# Patient Record
Sex: Male | Born: 1990 | Hispanic: Yes | Marital: Single | Smoking: Former smoker
Health system: Southern US, Community
[De-identification: ages and names within clinical notes are randomized; demographics above are authoritative.]

---
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
## 2018-10-03 ENCOUNTER — Encounter: Payer: Self-pay | Admitting: Emergency Medicine

## 2018-10-03 ENCOUNTER — Emergency Department
Admission: EM | Admit: 2018-10-03 | Discharge: 2018-10-03 | Disposition: A | Discharge status: Home / Self Care | Payer: Self-pay | Attending: Emergency Medicine | Admitting: Emergency Medicine

## 2018-10-03 DIAGNOSIS — L980 Pyogenic granuloma: Secondary | ICD-10-CM | POA: Insufficient documentation

## 2018-10-03 DIAGNOSIS — L03317 Cellulitis of buttock: Secondary | ICD-10-CM | POA: Insufficient documentation

## 2018-10-03 MED ORDER — SULFAMETHOXAZOLE-TRIMETHOPRIM 800-160 MG PO TABS
1.0000 | ORAL_TABLET | Freq: Once | ORAL | Status: AC
  Administered 2018-10-03: 1 via ORAL
  Filled 2018-10-03: qty 1

## 2018-10-03 MED ORDER — TRAMADOL HCL 50 MG PO TABS: 50.0000 mg | ORAL_TABLET | Freq: Four times a day (QID) | ORAL | 0 refills | Status: DC | PRN

## 2018-10-03 MED ORDER — HYDROCODONE-ACETAMINOPHEN 5-325 MG PO TABS
1.0000 | ORAL_TABLET | Freq: Once | ORAL | Status: AC
  Administered 2018-10-03: 1 via ORAL
  Filled 2018-10-03: qty 1

## 2018-10-03 MED ORDER — SULFAMETHOXAZOLE-TRIMETHOPRIM 800-160 MG PO TABS: 1.0000 | ORAL_TABLET | Freq: Two times a day (BID) | ORAL | 0 refills | Status: DC

## 2018-10-03 NOTE — ED Provider Notes (Signed)
[facility] Emergency Department Provider Note  ____________________________________________  Time seen: Approximately 9:52 PM  I have reviewed the triage vital signs and the nursing notes.   HISTORY  Chief Complaint Abscess    HPI Gregory Sanford is a 28 y.o. male who presents the emergency department complaining of 2 different skin lesions.  Patient has noted lesions to the right inguinal region that have been there for months.  Patient reports that he has 3 well-circumscribed protruding lesions.  Areas are tender to palpation.  No surrounding erythema or edema.  These are nontender at rest.  Patient's main concern is a erythematous, painful lesion to the buttocks.  Patient reports that it is between the 2 buttocks is, worse on the right than left.  Patient denies any drainage from the area.  No fevers or chills.  No abdominal pain.  No other complaints at this time.         History reviewed. No pertinent past medical history.  There are no active problems to display for this patient.   History reviewed. No pertinent surgical history.  Prior to Admission medications   Medication Sig Start Date End Date Taking? Authorizing Provider  sulfamethoxazole-trimethoprim (BACTRIM DS) 800-160 MG tablet Take 1 tablet by mouth 2 (two) times daily. 10/03/18   Cuthriell, Delorise Royals, PA-C  traMADol (ULTRAM) 50 MG tablet Take 1 tablet (50 mg total) by mouth every 6 (six) hours as needed. 10/03/18   Cuthriell, Delorise Royals, PA-C    Allergies Patient has no known allergies.  History reviewed. No pertinent family history.  Social History Social History   Tobacco Use  . Smoking status: Never Smoker  . Smokeless tobacco: Never Used  Substance Use Topics  . Alcohol use: Not on file  . Drug use: Not on file     Review of Systems  Constitutional: No fever/chills Eyes: No visual changes. No discharge ENT: No upper respiratory complaints. Cardiovascular: no chest  pain. Respiratory: no cough. No SOB. Gastrointestinal: No abdominal pain.  No nausea, no vomiting. Musculoskeletal: Negative for musculoskeletal pain. Skin: 2 abnormal skin findings, 1 to the right groin 1 to the right buttocks. Neurological: Negative for headaches, focal weakness or numbness. 10-point ROS otherwise negative.  ____________________________________________   PHYSICAL EXAM:  VITAL SIGNS: ED Triage Vitals [10/03/18 2034]  Enc Vitals Group     BP (!) 147/76     Pulse Rate 100     Resp 18     Temp 98 F (36.7 C)     Temp Source Oral     SpO2 100 %     Weight      Height      Head Circumference      Peak Flow      Pain Score      Pain Loc      Pain Edu?      Excl. in GC?      Constitutional: Alert and oriented. Well appearing and in no acute distress. Eyes: Conjunctivae are normal. PERRL. EOMI. Head: Atraumatic. Neck: No stridor.    Cardiovascular: Normal rate, regular rhythm. Normal S1 and S2.  Good peripheral circulation. Respiratory: Normal respiratory effort without tachypnea or retractions. Lungs CTAB. Good air entry to the bases with no decreased or absent breath sounds. Gastrointestinal: Bowel sounds 4 quadrants. Soft and nontender to palpation. No guarding or rigidity. No palpable masses. No distention. No CVA tenderness. Musculoskeletal: Full range of motion to all extremities. No gross deformities appreciated. Neurologic:  Normal  speech and language. No gross focal neurologic deficits are appreciated.  Skin:  Skin is warm, dry and intact. No rash noted.  Examination of the right groin reveals 3 distinct well-circumscribed lesions consistent with pyogenic granuloma.  No active bleeding.  No surrounding erythema or edema concerning for cellulitis.  Patient has a erythematous, raised lesion to the right medial buttocks.  This is almost in the perineal region.  No perianal or perirectal involvement.  Area is firm to palpation with no fluctuance or  induration.  No drainage from the lesion. Psychiatric: Mood and affect are normal. Speech and behavior are normal. Patient exhibits appropriate insight and judgement.   ____________________________________________   LABS (all labs ordered are listed, but only abnormal results are displayed)  Labs Reviewed - No data to display ____________________________________________  EKG   ____________________________________________  RADIOLOGY   No results found.  ____________________________________________    PROCEDURES  Procedure(s) performed:    Procedures    Medications  sulfamethoxazole-trimethoprim (BACTRIM DS) 800-160 MG per tablet 1 tablet (has no administration in time range)     ____________________________________________   INITIAL IMPRESSION / ASSESSMENT AND PLAN / ED COURSE  Pertinent labs & imaging results that were available during my care of the patient were reviewed by me and considered in my medical decision making (see chart for details).  Review of the [place] CSRS was performed in accordance of the [facility] prior to dispensing any controlled drugs.           Patient's diagnosis is consistent with cellulitis of the right buttock, pyogenic granuloma of the right inguinal fold.  Patient presented to the emergency department with a complaint of 2 different skin lesions.  Asymptomatic lesion in the right groin appears to be the pyogenic granuloma.  Patient does have findings consistent with cellulitis of the right buttock with no appreciable abscess requiring incision and drainage.  Patient will be placed on Bactrim for cellulitis, limited prescription of Ultram for pain relief.  Follow-up with dermatology.. Patient is given ED precautions to return to the ED for any worsening or new symptoms.     ____________________________________________  FINAL CLINICAL IMPRESSION(S) / ED DIAGNOSES  Final diagnoses:  Cellulitis of right buttock  Pyogenic granuloma       NEW MEDICATIONS STARTED DURING THIS VISIT:  ED Discharge Orders         Ordered    sulfamethoxazole-trimethoprim (BACTRIM DS) 800-160 MG tablet  2 times daily     10/03/18 2157    traMADol (ULTRAM) 50 MG tablet  Every 6 hours PRN     10/03/18 2157              This chart was dictated using voice recognition software/Dragon. Despite best efforts to proofread, errors can occur which can change the meaning. Any change was purely unintentional.    Racheal PatchesCuthriell, Jonathan D, PA-C 10/03/18 2158    Phineas SemenGoodman, Graydon, MD 10/03/18 2228

## 2018-10-03 NOTE — ED Triage Notes (Signed)
Pt reports raised reddened area to the right inner groin as well as under the left buttocks. Pt reports area to buttocks x1 day and the one on the right groin x3 months. Pt denies drainage, denies fever.

## 2019-03-12 ENCOUNTER — Emergency Department
Admission: EM | Admit: 2019-03-12 | Discharge: 2019-03-13 | Disposition: A | Discharge status: Home / Self Care | Payer: Self-pay | Attending: Emergency Medicine | Admitting: Emergency Medicine

## 2019-03-12 ENCOUNTER — Other Ambulatory Visit: Payer: Self-pay

## 2019-03-12 ENCOUNTER — Encounter: Payer: Self-pay | Admitting: *Deleted

## 2019-03-12 DIAGNOSIS — F17200 Nicotine dependence, unspecified, uncomplicated: Secondary | ICD-10-CM | POA: Insufficient documentation

## 2019-03-12 DIAGNOSIS — R109 Unspecified abdominal pain: Secondary | ICD-10-CM

## 2019-03-12 DIAGNOSIS — K2970 Gastritis, unspecified, without bleeding: Secondary | ICD-10-CM | POA: Insufficient documentation

## 2019-03-12 LAB — COMPREHENSIVE METABOLIC PANEL
ALT: 74 U/L — ABNORMAL HIGH (ref 0–44)
AST: 41 U/L (ref 15–41)
Albumin: 4.4 g/dL (ref 3.5–5.0)
Alkaline Phosphatase: 66 U/L (ref 38–126)
Anion gap: 14 (ref 5–15)
BUN: 12 mg/dL (ref 6–20)
CO2: 20 mmol/L — ABNORMAL LOW (ref 22–32)
Calcium: 9.1 mg/dL (ref 8.9–10.3)
Chloride: 105 mmol/L (ref 98–111)
Creatinine, Ser: 1.07 mg/dL (ref 0.61–1.24)
GFR calc Af Amer: >60 mL/min (ref >60)
GFR calc non Af Amer: >60 mL/min (ref >60)
Glucose, Bld: 120 mg/dL — ABNORMAL HIGH (ref 70–99)
Potassium: 3.9 mmol/L (ref 3.5–5.1)
Sodium: 139 mmol/L (ref 135–145)
Total Bilirubin: 0.8 mg/dL (ref 0.3–1.2)
Total Protein: 7.6 g/dL (ref 6.5–8.1)

## 2019-03-12 LAB — CBC
HCT: 46.6 % (ref 39.0–52.0)
Hemoglobin: 16.4 g/dL (ref 13.0–17.0)
MCH: 29.3 pg (ref 26.0–34.0)
MCHC: 35.2 g/dL (ref 30.0–36.0)
MCV: 83.2 fL (ref 80.0–100.0)
Platelets: 205 10*3/uL (ref 150–400)
RBC: 5.6 MIL/uL (ref 4.22–5.81)
RDW: 12.8 % (ref 11.5–15.5)
WBC: 8.2 10*3/uL (ref 4.0–10.5)
nRBC: 0 % (ref 0.0–0.2)

## 2019-03-12 LAB — URINALYSIS, COMPLETE (UACMP) WITH MICROSCOPIC
Bacteria, UA: NONE SEEN
Bilirubin Urine: NEGATIVE
Glucose, UA: NEGATIVE mg/dL
Hgb urine dipstick: NEGATIVE
Ketones, ur: NEGATIVE mg/dL
Leukocytes,Ua: NEGATIVE
Nitrite: NEGATIVE
Protein, ur: NEGATIVE mg/dL
Specific Gravity, Urine: 1.003 — ABNORMAL LOW (ref 1.005–1.030)
Squamous Epithelial / HPF: NONE SEEN (ref 0–5)
WBC, UA: NONE SEEN WBC/hpf (ref 0–5)
pH: 7 (ref 5.0–8.0)

## 2019-03-12 LAB — LIPASE, BLOOD: Lipase: 38 U/L (ref 11–51)

## 2019-03-12 MED ORDER — SODIUM CHLORIDE 0.9% FLUSH: 3.0000 mL | Freq: Once | INTRAVENOUS | Status: DC

## 2019-03-12 NOTE — ED Triage Notes (Signed)
Pt has left upper abd pain.  Pt saw dermatologist for toenail fungus and started meds yesterday.  Pt has nausea.  Pt alert.

## 2019-03-12 NOTE — ED Provider Notes (Signed)
[facility] Emergency Department Provider Note    ____________________________________________   I have reviewed the triage vital signs and the nursing notes.   HISTORY  Chief Complaint Abdominal Pain   History limited by: Not Limited   HPI Gregory Sanford is a 28 y.o. male who presents to the emergency department today because of concern for left sided abdominal pain. The patient states that it started yesterday after he took an antifungal medication for the first time. He says the pain is located in the left upper quadrant. He did make himself throw up. He says the pain has been constant. It is severe enough it has prevented him from sleeping. The patient has history of acid reflux. Says he does not regularly take his medication, told by a doctor to only take it when needed.    Records reviewed. Per medical record review patient has a history of acid reflux.   No past medical history on file.  There are no active problems to display for this patient.   No past surgical history on file.  Prior to Admission medications   Medication Sig Start Date End Date Taking? Authorizing Provider  sulfamethoxazole-trimethoprim (BACTRIM DS) 800-160 MG tablet Take 1 tablet by mouth 2 (two) times daily. 10/03/18   Cuthriell, Delorise Royals, PA-C  traMADol (ULTRAM) 50 MG tablet Take 1 tablet (50 mg total) by mouth every 6 (six) hours as needed. 10/03/18   Cuthriell, Delorise Royals, PA-C    Allergies Patient has no known allergies.  No family history on file.  Social History Social History   Tobacco Use  . Smoking status: Current Every Day Smoker  . Smokeless tobacco: Never Used  Substance Use Topics  . Alcohol use: Not Currently  . Drug use: Not Currently    Review of Systems Constitutional: No fever/chills Eyes: No visual changes. ENT: No sore throat. Cardiovascular: Denies chest pain. Respiratory: Denies shortness of breath. Gastrointestinal: Positive for abdominal  pain. Genitourinary: Negative for dysuria. Musculoskeletal: Negative for back pain. Skin: Negative for rash. Neurological: Negative for headaches, focal weakness or numbness.  ____________________________________________   PHYSICAL EXAM:  VITAL SIGNS: ED Triage Vitals  Enc Vitals Group     BP 03/12/19 2209 (!) 153/75     Pulse Rate 03/12/19 2209 90     Resp 03/12/19 2209 18     Temp 03/12/19 2209 99.1 F (37.3 C)     Temp Source 03/12/19 2209 Oral     SpO2 03/12/19 2209 99 %     Weight 03/12/19 2206 293 lb (132.9 kg)     Height 03/12/19 2206 5\' 7"  (1.702 m)     Head Circumference --      Peak Flow --      Pain Score 03/12/19 2206 5   Constitutional: Alert and oriented.  Eyes: Conjunctivae are normal.  ENT      Head: Normocephalic and atraumatic.      Nose: No congestion/rhinnorhea.      Mouth/Throat: Mucous membranes are moist.      Neck: No stridor. Hematological/Lymphatic/Immunilogical: No cervical lymphadenopathy. Cardiovascular: Normal rate, regular rhythm.  No murmurs, rubs, or gallops.  Respiratory: Normal respiratory effort without tachypnea nor retractions. Breath sounds are clear and equal bilaterally. No wheezes/rales/rhonchi. Gastrointestinal: Soft and minimally tender in the left upper quadrant.  Genitourinary: Deferred Musculoskeletal: Normal range of motion in all extremities. No lower extremity edema. Neurologic:  Normal speech and language. No gross focal neurologic deficits are appreciated.  Skin:  Skin is warm,  dry and intact. No rash noted. Psychiatric: Mood and affect are normal. Speech and behavior are normal. Patient exhibits appropriate insight and judgment.  ____________________________________________    LABS (pertinent positives/negatives)  Lipase 38 CMP wnl except co2 20, glu 120, alt 74 CBC wbc 8.2, hgb 16.4, plt 205 UA  unremarkable ____________________________________________   EKG  None  ____________________________________________    RADIOLOGY  None  ____________________________________________   PROCEDURES  Procedures  ____________________________________________   INITIAL IMPRESSION / ASSESSMENT AND PLAN / ED COURSE  Pertinent labs & imaging results that were available during my care of the patient were reviewed by me and considered in my medical decision making (see chart for details).   Patient presented to the emergency department today because of concerns for abdominal pain.  States he has a history of acid reflux all the is currently not taking an antacid.  Patient's blood work without concerning leukocytosis, lipase was negative.  Patient did get some relief from GI cocktail.  At this point I do think likely gastritis.  Discussed this with patient.  Will plan on discharging with prescription for viscous lidocaine and antiacid.  ____________________________________________   FINAL CLINICAL IMPRESSION(S) / ED DIAGNOSES  Final diagnoses:  Abdominal pain, unspecified abdominal location  Gastritis, presence of bleeding unspecified, unspecified chronicity, unspecified gastritis type     Note: This dictation was prepared with Dragon dictation. Any transcriptional errors that result from this process are unintentional     Nance Pear, MD 03/13/19 [phone]

## 2019-03-13 MED ORDER — LIDOCAINE VISCOUS HCL 2 % MT SOLN
15.0000 mL | Freq: Once | OROMUCOSAL | Status: AC
  Administered 2019-03-13: 15 mL via ORAL
  Filled 2019-03-13: qty 15

## 2019-03-13 MED ORDER — FAMOTIDINE 20 MG PO TABS: 20.0000 mg | ORAL_TABLET | Freq: Every day | ORAL | 1 refills | Status: DC

## 2019-03-13 MED ORDER — ALUM & MAG HYDROXIDE-SIMETH 200-200-20 MG/5ML PO SUSP
30.0000 mL | Freq: Once | ORAL | Status: AC
  Administered 2019-03-13: 30 mL via ORAL
  Filled 2019-03-13: qty 30

## 2019-03-13 MED ORDER — LIDOCAINE VISCOUS HCL 2 % MT SOLN: 15.0000 mL | OROMUCOSAL | 0 refills | Status: DC | PRN

## 2019-03-13 NOTE — Discharge Instructions (Addendum)
Please seek medical attention for any high fevers, chest pain, shortness of breath, change in behavior, persistent vomiting, bloody stool or any other new or concerning symptoms.  

## 2019-03-21 ENCOUNTER — Telehealth: Payer: Self-pay | Admitting: Gerontology

## 2019-03-21 NOTE — Telephone Encounter (Signed)
Discussed eligibility requirements with patient, he did not have proof of income and did not have a drivers license, patient ended call

## 2019-06-18 ENCOUNTER — Ambulatory Visit: Payer: No Typology Code available for payment source | Attending: Internal Medicine

## 2019-06-18 DIAGNOSIS — Z20822 Contact with and (suspected) exposure to covid-19: Secondary | ICD-10-CM

## 2019-06-18 DIAGNOSIS — U071 COVID-19: Secondary | ICD-10-CM | POA: Insufficient documentation

## 2019-06-19 LAB — NOVEL CORONAVIRUS, NAA: SARS-CoV-2, NAA: DETECTED — AB

## 2019-06-20 ENCOUNTER — Telehealth: Payer: Self-pay | Admitting: Nurse Practitioner

## 2019-06-20 NOTE — Telephone Encounter (Signed)
Called to discuss with Gregory Sanford about Covid symptoms and the use of bamlanivimab, a monoclonal antibody infusion for those with mild to moderate Covid symptoms and at a high risk of hospitalization.     Pt is qualified (per chart review) for this infusion at the [facility] infusion center due to co-morbid conditions (BMI 45) and/or a member of an at-risk group.  Message and callback number left for patient to return call if interested in infusion. Last date he would be eligible for infusion based on positive test is 06/26/19 however further investigation would be needed regarding symptom onset prior to scheduling.   There are no problems to display for this patient.   Willette Alma, AGPCNP-BC Pager: [phone] Amion: Thea Alken

## 2019-06-21 ENCOUNTER — Emergency Department: Payer: Self-pay

## 2019-06-21 ENCOUNTER — Other Ambulatory Visit: Payer: Self-pay

## 2019-06-21 ENCOUNTER — Emergency Department
Admission: EM | Admit: 2019-06-21 | Discharge: 2019-06-21 | Disposition: A | Discharge status: Home / Self Care | Payer: Self-pay | Attending: Emergency Medicine | Admitting: Emergency Medicine

## 2019-06-21 DIAGNOSIS — Z5321 Procedure and treatment not carried out due to patient leaving prior to being seen by health care provider: Secondary | ICD-10-CM | POA: Insufficient documentation

## 2019-06-21 DIAGNOSIS — U071 COVID-19: Secondary | ICD-10-CM | POA: Insufficient documentation

## 2019-06-21 NOTE — ED Notes (Signed)
Called no answer

## 2019-06-21 NOTE — ED Triage Notes (Signed)
FIRST NURSE NOTE- here for [facility]. covid +.  sats 98% RA at check in

## 2019-06-21 NOTE — ED Triage Notes (Signed)
Pt states that he was diagnosed with covid 3 days ago and was doing ok, states that today he was taking a nap and woke up feeling like he couldn't take a deep breath, states that he attempted to yawn and it wouldn't go all the way down, pt states that it doesn't go through, speaking of his deep breath

## 2019-06-21 NOTE — ED Notes (Signed)
Called in waiting room with no answer 

## 2019-06-21 NOTE — ED Triage Notes (Signed)
CALLED NO ANSWER

## 2019-06-25 ENCOUNTER — Other Ambulatory Visit: Payer: Self-pay

## 2019-06-25 ENCOUNTER — Emergency Department: Payer: Self-pay

## 2019-06-25 ENCOUNTER — Emergency Department
Admission: EM | Admit: 2019-06-25 | Discharge: 2019-06-25 | Disposition: A | Discharge status: Home / Self Care | Payer: Self-pay | Attending: Emergency Medicine | Admitting: Emergency Medicine

## 2019-06-25 ENCOUNTER — Encounter: Payer: Self-pay | Admitting: Emergency Medicine

## 2019-06-25 DIAGNOSIS — Z5321 Procedure and treatment not carried out due to patient leaving prior to being seen by health care provider: Secondary | ICD-10-CM | POA: Insufficient documentation

## 2019-06-25 DIAGNOSIS — R079 Chest pain, unspecified: Secondary | ICD-10-CM | POA: Insufficient documentation

## 2019-06-25 DIAGNOSIS — R0602 Shortness of breath: Secondary | ICD-10-CM | POA: Insufficient documentation

## 2019-06-25 LAB — CBC
HCT: 47.4 % (ref 39.0–52.0)
Hemoglobin: 16.5 g/dL (ref 13.0–17.0)
MCH: 29.2 pg (ref 26.0–34.0)
MCHC: 34.8 g/dL (ref 30.0–36.0)
MCV: 83.7 fL (ref 80.0–100.0)
Platelets: 132 10*3/uL — ABNORMAL LOW (ref 150–400)
RBC: 5.66 MIL/uL (ref 4.22–5.81)
RDW: 13.1 % (ref 11.5–15.5)
WBC: 5.2 10*3/uL (ref 4.0–10.5)
nRBC: 0 % (ref 0.0–0.2)

## 2019-06-25 LAB — COMPREHENSIVE METABOLIC PANEL
ALT: 79 U/L — ABNORMAL HIGH (ref 0–44)
AST: 59 U/L — ABNORMAL HIGH (ref 15–41)
Albumin: 4 g/dL (ref 3.5–5.0)
Alkaline Phosphatase: 64 U/L (ref 38–126)
Anion gap: 10 (ref 5–15)
BUN: 12 mg/dL (ref 6–20)
CO2: 23 mmol/L (ref 22–32)
Calcium: 9 mg/dL (ref 8.9–10.3)
Chloride: 105 mmol/L (ref 98–111)
Creatinine, Ser: 0.97 mg/dL (ref 0.61–1.24)
GFR calc Af Amer: >60 mL/min (ref >60)
GFR calc non Af Amer: >60 mL/min (ref >60)
Glucose, Bld: 120 mg/dL — ABNORMAL HIGH (ref 70–99)
Potassium: 3.7 mmol/L (ref 3.5–5.1)
Sodium: 138 mmol/L (ref 135–145)
Total Bilirubin: 0.7 mg/dL (ref 0.3–1.2)
Total Protein: 7.5 g/dL (ref 6.5–8.1)

## 2019-06-25 NOTE — ED Triage Notes (Signed)
Pt called from [facility] to treatment room, no response 

## 2019-06-25 NOTE — ED Notes (Signed)
Called at 1145 am  No answer in lobby

## 2019-06-25 NOTE — ED Triage Notes (Signed)
Pt diagnosed with covid since last Tuesday, chest has been hurting intermittently the past few days, unable to get any rest, feeling better than he was a week ago except for the pain in his chest. Occasionally shob. Has done home remedies for the pain with no relief.

## 2019-10-09 ENCOUNTER — Emergency Department
Admission: EM | Admit: 2019-10-09 | Discharge: 2019-10-09 | Disposition: A | Discharge status: Home / Self Care | Payer: Self-pay | Attending: Student in an Organized Health Care Education/Training Program | Admitting: Student in an Organized Health Care Education/Training Program

## 2019-10-09 ENCOUNTER — Other Ambulatory Visit: Payer: Self-pay

## 2019-10-09 ENCOUNTER — Encounter: Payer: Self-pay | Admitting: Emergency Medicine

## 2019-10-09 DIAGNOSIS — R110 Nausea: Secondary | ICD-10-CM | POA: Insufficient documentation

## 2019-10-09 DIAGNOSIS — F17200 Nicotine dependence, unspecified, uncomplicated: Secondary | ICD-10-CM | POA: Insufficient documentation

## 2019-10-09 DIAGNOSIS — R1013 Epigastric pain: Secondary | ICD-10-CM | POA: Insufficient documentation

## 2019-10-09 LAB — CBC
HCT: 46.9 % (ref 39.0–52.0)
Hemoglobin: 16.3 g/dL (ref 13.0–17.0)
MCH: 29.4 pg (ref 26.0–34.0)
MCHC: 34.8 g/dL (ref 30.0–36.0)
MCV: 84.5 fL (ref 80.0–100.0)
Platelets: 204 10*3/uL (ref 150–400)
RBC: 5.55 MIL/uL (ref 4.22–5.81)
RDW: 12.6 % (ref 11.5–15.5)
WBC: 7.6 10*3/uL (ref 4.0–10.5)
nRBC: 0 % (ref 0.0–0.2)

## 2019-10-09 LAB — COMPREHENSIVE METABOLIC PANEL
ALT: 67 U/L — ABNORMAL HIGH (ref 0–44)
AST: 35 U/L (ref 15–41)
Albumin: 4 g/dL (ref 3.5–5.0)
Alkaline Phosphatase: 64 U/L (ref 38–126)
Anion gap: 10 (ref 5–15)
BUN: 9 mg/dL (ref 6–20)
CO2: 24 mmol/L (ref 22–32)
Calcium: 9 mg/dL (ref 8.9–10.3)
Chloride: 103 mmol/L (ref 98–111)
Creatinine, Ser: 0.76 mg/dL (ref 0.61–1.24)
GFR calc Af Amer: >60 mL/min (ref >60)
GFR calc non Af Amer: >60 mL/min (ref >60)
Glucose, Bld: 101 mg/dL — ABNORMAL HIGH (ref 70–99)
Potassium: 3.5 mmol/L (ref 3.5–5.1)
Sodium: 137 mmol/L (ref 135–145)
Total Bilirubin: 0.6 mg/dL (ref 0.3–1.2)
Total Protein: 7.3 g/dL (ref 6.5–8.1)

## 2019-10-09 LAB — URINALYSIS, COMPLETE (UACMP) WITH MICROSCOPIC
Bacteria, UA: NONE SEEN
Bilirubin Urine: NEGATIVE
Glucose, UA: NEGATIVE mg/dL
Hgb urine dipstick: NEGATIVE
Ketones, ur: NEGATIVE mg/dL
Leukocytes,Ua: NEGATIVE
Nitrite: NEGATIVE
Protein, ur: NEGATIVE mg/dL
Specific Gravity, Urine: 1.011 (ref 1.005–1.030)
Squamous Epithelial / HPF: NONE SEEN (ref 0–5)
pH: 6 (ref 5.0–8.0)

## 2019-10-09 LAB — TROPONIN I (HIGH SENSITIVITY): Troponin I (High Sensitivity): 5 ng/L (ref <18)

## 2019-10-09 LAB — LIPASE, BLOOD: Lipase: 33 U/L (ref 11–51)

## 2019-10-09 MED ORDER — FAMOTIDINE 20 MG PO TABS
40.0000 mg | ORAL_TABLET | Freq: Once | ORAL | Status: AC
  Administered 2019-10-09: 40 mg via ORAL
  Filled 2019-10-09: qty 2

## 2019-10-09 MED ORDER — FAMOTIDINE 20 MG PO TABS
20.0000 mg | ORAL_TABLET | Freq: Two times a day (BID) | ORAL | 0 refills | Status: DC
Start: 2019-10-09 — End: 2020-10-01

## 2019-10-09 MED ORDER — SODIUM CHLORIDE 0.9% FLUSH: 3.0000 mL | Freq: Once | INTRAVENOUS | Status: DC

## 2019-10-09 NOTE — ED Triage Notes (Signed)
Pt to ED from home c/o LUQ pain that started yesterday after eating.  States pain is burning and cramping, taking TUMS at home without relief.  Nausea without vomiting or diarrhea. Denies urinary changes.  Pt A&Ox4, chest rise even and unlabored, in NAD at this time.

## 2019-10-09 NOTE — ED Provider Notes (Signed)
Emergency Department Provider Note  ____________________________________________  Time seen: Approximately 10:21 PM  I have reviewed the triage vital signs and the nursing notes.   HISTORY  Chief Complaint Abdominal Pain   Historian Patient     HPI Gregory Sanford is a 29 y.o. male presents to the emergency department with burning left upper quadrant abdominal pain that started after patient had chicken fried steak.  Patient states that he experiences discomfort intermittently after eating.  No chest pain, chest tightness or shortness of breath.  Patient states that he occasionally experiences nausea but is not currently nauseated.  No vomiting or diarrhea.  Patient stated that he tried Tums prior to presenting to the emergency department and his pain is not completely resolved although it has improved.   History reviewed. No pertinent past medical history.   Immunizations up to date:  Yes.     History reviewed. No pertinent past medical history.  There are no problems to display for this patient.   History reviewed. No pertinent surgical history.  Prior to Admission medications   Medication Sig Start Date End Date Taking? Authorizing Provider  famotidine (PEPCID) 20 MG tablet Take 1 tablet (20 mg total) by mouth 2 (two) times daily. 10/09/19   Orvil Feil, PA-C  lidocaine (XYLOCAINE) 2 % solution Use as directed 15 mLs in the mouth or throat every 4 (four) hours as needed (Abdominal pain). 03/13/19   Phineas Semen, MD  sulfamethoxazole-trimethoprim (BACTRIM DS) 800-160 MG tablet Take 1 tablet by mouth 2 (two) times daily. 10/03/18   Cuthriell, Delorise Royals, PA-C  traMADol (ULTRAM) 50 MG tablet Take 1 tablet (50 mg total) by mouth every 6 (six) hours as needed. 10/03/18   Cuthriell, Delorise Royals, PA-C    Allergies Patient has no known allergies.  History reviewed. No pertinent family history.  Social History Social History   Tobacco Use  . Smoking status: Current  Every Day Smoker  . Smokeless tobacco: Never Used  Substance Use Topics  . Alcohol use: Not Currently  . Drug use: Not Currently    Types: Marijuana     Review of Systems  Constitutional: No fever/chills Eyes:  No discharge ENT: No upper respiratory complaints. Respiratory: no cough. No SOB/ use of accessory muscles to breath Gastrointestinal: Patient has burning left upper quadrant and epigastric abdominal pain. Musculoskeletal: Negative for musculoskeletal pain. Skin: Negative for rash, abrasions, lacerations, ecchymosis.    ____________________________________________   PHYSICAL EXAM:  VITAL SIGNS: ED Triage Vitals  Enc Vitals Group     BP 10/09/19 2008 (!) 145/93     Pulse Rate 10/09/19 2008 84     Resp 10/09/19 2008 16     Temp 10/09/19 2008 98.5 F (36.9 C)     Temp Source 10/09/19 2008 Oral     SpO2 10/09/19 2008 98 %     Weight 10/09/19 2009 300 lb (136.1 kg)     Height 10/09/19 2009 5\' 7"  (1.702 m)     Head Circumference --      Peak Flow --      Pain Score 10/09/19 2009 7     Pain Loc --      Pain Edu? --      Excl. in GC? --      Constitutional: Alert and oriented. Well appearing and in no acute distress. Eyes: Conjunctivae are normal. PERRL. EOMI. Head: Atraumatic. Cardiovascular: Normal rate, regular rhythm. Normal S1 and S2.  Good peripheral circulation. Respiratory: Normal respiratory effort without tachypnea  or retractions. Lungs CTAB. Good air entry to the bases with no decreased or absent breath sounds Gastrointestinal: Bowel sounds x 4 quadrants. Soft and nontender to palpation. No guarding or rigidity. No distention. Musculoskeletal: Full range of motion to all extremities. No obvious deformities noted Neurologic:  Normal for age. No gross focal neurologic deficits are appreciated.  Skin:  Skin is warm, dry and intact. No rash noted. Psychiatric: Mood and affect are normal for age. Speech and behavior are normal.    ____________________________________________   LABS (all labs ordered are listed, but only abnormal results are displayed)  Labs Reviewed  COMPREHENSIVE METABOLIC PANEL - Abnormal; Notable for the following components:      Result Value   Glucose, Bld 101 (*)    ALT 67 (*)    All other components within normal limits  URINALYSIS, COMPLETE (UACMP) WITH MICROSCOPIC - Abnormal; Notable for the following components:   Color, Urine YELLOW (*)    APPearance CLEAR (*)    All other components within normal limits  LIPASE, BLOOD  CBC  TROPONIN I (HIGH SENSITIVITY)  TROPONIN I (HIGH SENSITIVITY)   ____________________________________________  EKG   ____________________________________________  RADIOLOGY   No results found.  ____________________________________________    PROCEDURES  Procedure(s) performed:     Procedures     Medications  sodium chloride flush (NS) 0.9 % injection 3 mL (has no administration in time range)  famotidine (PEPCID) tablet 40 mg (40 mg Oral Given 10/09/19 2039)     ____________________________________________   INITIAL IMPRESSION / ASSESSMENT AND PLAN / ED COURSE  Pertinent labs & imaging results that were available during my care of the patient were reviewed by me and considered in my medical decision making (see chart for details).      Assessment and plan Abdominal pain 29 year old male presents to the emergency department with burning left upper quadrant abdominal pain.  Patient was mildly hypertensive at triage but vital signs were otherwise reassuring.  Abdomen was soft and nontender on exam.  CBC and CMP were reassuring.  Troponin was within reference range.  Lipase was within reference range.  Urinalysis revealed no acute abnormality.  Patient reported that his pain resolved after Pepcid was administered.  Patient was discharged with Pepcid.  Return precautions were given to return with new or worsening symptoms.  All  patient questions were answered.    ____________________________________________  FINAL CLINICAL IMPRESSION(S) / ED DIAGNOSES  Final diagnoses:  Epigastric pain      NEW MEDICATIONS STARTED DURING THIS VISIT:  ED Discharge Orders         Ordered    famotidine (PEPCID) 20 MG tablet  2 times daily     10/09/19 2112              This chart was dictated using voice recognition software/Dragon. Despite best efforts to proofread, errors can occur which can change the meaning. Any change was purely unintentional.     Karren Cobble 10/09/19 2225    Merlyn Lot, MD 10/09/19 [phone]

## 2019-10-09 NOTE — ED Notes (Signed)
See triage note, pt reports pain to LUQ that started yesterday intermittent.  Pt reports hx acid reflux.  Denies CP.  Reports Nausea, denies vomiting.

## 2019-10-09 NOTE — Discharge Instructions (Signed)
Take 40 mg of Pepcid once daily as needed for acid reflux.

## 2020-01-02 ENCOUNTER — Emergency Department: Payer: No Typology Code available for payment source

## 2020-01-02 ENCOUNTER — Other Ambulatory Visit: Payer: Self-pay

## 2020-01-02 ENCOUNTER — Emergency Department
Admission: EM | Admit: 2020-01-02 | Discharge: 2020-01-02 | Disposition: A | Discharge status: Home / Self Care | Payer: No Typology Code available for payment source | Attending: Emergency Medicine | Admitting: Emergency Medicine

## 2020-01-02 DIAGNOSIS — M542 Cervicalgia: Secondary | ICD-10-CM | POA: Insufficient documentation

## 2020-01-02 DIAGNOSIS — Z87891 Personal history of nicotine dependence: Secondary | ICD-10-CM | POA: Diagnosis not present

## 2020-01-02 DIAGNOSIS — M545 Low back pain: Secondary | ICD-10-CM | POA: Insufficient documentation

## 2020-01-02 DIAGNOSIS — Y999 Unspecified external cause status: Secondary | ICD-10-CM | POA: Insufficient documentation

## 2020-01-02 DIAGNOSIS — Y9389 Activity, other specified: Secondary | ICD-10-CM | POA: Insufficient documentation

## 2020-01-02 DIAGNOSIS — R519 Headache, unspecified: Secondary | ICD-10-CM | POA: Insufficient documentation

## 2020-01-02 DIAGNOSIS — Y92410 Unspecified street and highway as the place of occurrence of the external cause: Secondary | ICD-10-CM | POA: Insufficient documentation

## 2020-01-02 DIAGNOSIS — Z041 Encounter for examination and observation following transport accident: Secondary | ICD-10-CM | POA: Diagnosis present

## 2020-01-02 MED ORDER — METHOCARBAMOL 500 MG PO TABS: 500.0000 mg | ORAL_TABLET | Freq: Three times a day (TID) | ORAL | 0 refills | Status: AC | PRN
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
## 2020-03-27 ENCOUNTER — Emergency Department
Admission: EM | Admit: 2020-03-27 | Discharge: 2020-03-27 | Disposition: A | Discharge status: Home / Self Care | Payer: Self-pay | Attending: Emergency Medicine | Admitting: Emergency Medicine

## 2020-03-27 ENCOUNTER — Other Ambulatory Visit: Payer: Self-pay

## 2020-03-27 DIAGNOSIS — G4730 Sleep apnea, unspecified: Secondary | ICD-10-CM | POA: Insufficient documentation

## 2020-03-27 DIAGNOSIS — Z87891 Personal history of nicotine dependence: Secondary | ICD-10-CM | POA: Insufficient documentation

## 2020-03-27 DIAGNOSIS — R739 Hyperglycemia, unspecified: Secondary | ICD-10-CM | POA: Insufficient documentation

## 2020-03-27 LAB — CBG MONITORING, ED: Glucose-Capillary: 190 mg/dL — ABNORMAL HIGH (ref 70–99)

## 2020-03-27 NOTE — ED Provider Notes (Signed)
[facility] Emergency Department Provider Note  ____________________________________________  Time seen: Approximately 4:21 PM  I have reviewed the triage vital signs and the nursing notes.   HISTORY  Chief Complaint Fatigue    HPI Gregory Sanford is a 29 y.o. male that presents to the emergency department requesting a referral to a sleep apnea center.  Patient states that he is tired in the morning because he spends all night waking up.  He is told that he is snoring and talking in his sleep.  Sometimes he wakes up and feels like he needs to gasp for air.  Patient thinks that this is because he he has gained weight in the last year.  He has been trying to exercise and lose the weight.  He does not have a primary care but has an appointment with primary care for next week.  History reviewed. No pertinent past medical history.  There are no problems to display for this patient.   History reviewed. No pertinent surgical history.  Prior to Admission medications   Medication Sig Start Date End Date Taking? Authorizing Provider  famotidine (PEPCID) 20 MG tablet Take 1 tablet (20 mg total) by mouth 2 (two) times daily. 10/09/19   Orvil Feil, PA-C  lidocaine (XYLOCAINE) 2 % solution Use as directed 15 mLs in the mouth or throat every 4 (four) hours as needed (Abdominal pain). 03/13/19   Phineas Semen, MD  sulfamethoxazole-trimethoprim (BACTRIM DS) 800-160 MG tablet Take 1 tablet by mouth 2 (two) times daily. 10/03/18   Cuthriell, Delorise Royals, PA-C  traMADol (ULTRAM) 50 MG tablet Take 1 tablet (50 mg total) by mouth every 6 (six) hours as needed. 10/03/18   Cuthriell, Delorise Royals, PA-C    Allergies Patient has no known allergies.  No family history on file.  Social History Social History   Tobacco Use  . Smoking status: Former Games developer  . Smokeless tobacco: Never Used  Substance Use Topics  . Alcohol use: Not Currently  . Drug use: Not Currently    Types:  Marijuana     Review of Systems  Cardiovascular: No chest pain. Respiratory: No SOB. Gastrointestinal: No abdominal pain.  No nausea, no vomiting.  Musculoskeletal: Negative for musculoskeletal pain. Skin: Negative for rash, abrasions, lacerations, ecchymosis. Neurological: Negative for headaches   ____________________________________________   PHYSICAL EXAM:  VITAL SIGNS: ED Triage Vitals  Enc Vitals Group     BP 03/27/20 1234 128/84     Pulse Rate 03/27/20 1234 99     Resp 03/27/20 1234 18     Temp 03/27/20 1234 98.2 F (36.8 C)     Temp Source 03/27/20 1234 Oral     SpO2 03/27/20 1234 98 %     Weight 03/27/20 1235 (!) 320 lb (145.2 kg)     Height 03/27/20 1235 5\' 7"  (1.702 m)     Head Circumference --      Peak Flow --      Pain Score 03/27/20 1235 0     Pain Loc --      Pain Edu? --      Excl. in GC? --      Constitutional: Alert and oriented. Well appearing and in no acute distress. Eyes: Conjunctivae are normal. PERRL. EOMI. Head: Atraumatic. ENT:      Ears:      Nose: No congestion/rhinnorhea.      Mouth/Throat: Mucous membranes are moist.  Neck: No stridor. Cardiovascular: Normal rate, regular rhythm.  Good peripheral circulation.  Respiratory: Normal respiratory effort without tachypnea or retractions. Lungs CTAB. Good air entry to the bases with no decreased or absent breath sounds. Musculoskeletal: Full range of motion to all extremities. No gross deformities appreciated. Neurologic:  Normal speech and language. No gross focal neurologic deficits are appreciated.  Skin:  Skin is warm, dry and intact. No rash noted. Psychiatric: Mood and affect are normal. Speech and behavior are normal. Patient exhibits appropriate insight and judgement.   ____________________________________________   LABS (all labs ordered are listed, but only abnormal results are displayed)  Labs Reviewed  CBG MONITORING, ED - Abnormal; Notable for the following components:       Result Value   Glucose-Capillary 190 (*)    All other components within normal limits   ____________________________________________  EKG   ____________________________________________  RADIOLOGY  No results found.  ____________________________________________    PROCEDURES  Procedure(s) performed:    Procedures    Medications - No data to display   ____________________________________________   INITIAL IMPRESSION / ASSESSMENT AND PLAN / ED COURSE  Pertinent labs & imaging results that were available during my care of the patient were reviewed by me and considered in my medical decision making (see chart for details).  Review of the [place] CSRS was performed in accordance of the [facility] prior to dispensing any controlled drugs.   Patient presented to the emergency department with concern for sleep apnea. Vital signs and exam are reassuring.  Patient had an elevated blood sugar during an ER visit in [place] a couple of weeks ago.  His blood sugar was rechecked at 190.  Patient is actively snoring in the emergency department and I agree that he has sleep apnea.  We discussed that I will give him the phone number for the sleep study center but he would likely need a referral from primary care.  He has an appointment with primary care next week.   Patient is to follow up with PCP as directed. Patient is given ED precautions to return to the ED for any worsening or new symptoms.  [name] was evaluated in Emergency Department on 03/27/2020 for the symptoms described in the history of present illness. He was evaluated in the context of the global COVID-19 pandemic, which necessitated consideration that the patient might be at risk for infection with the SARS-CoV-2 virus that causes COVID-19. Institutional protocols and algorithms that pertain to the evaluation of patients at risk for COVID-19 are in a state of rapid change based on information released by regulatory bodies  including the CDC and federal and state organizations. These policies and algorithms were followed during the patient's care in the ED.   ____________________________________________  FINAL CLINICAL IMPRESSION(S) / ED DIAGNOSES  Final diagnoses:  Hyperglycemia  Sleep apnea, unspecified type      NEW MEDICATIONS STARTED DURING THIS VISIT:  ED Discharge Orders    None          This chart was dictated using voice recognition software/Dragon. Despite best efforts to proofread, errors can occur which can change the meaning. Any change was purely unintentional.    Enid Derry, PA-C 03/27/20 1654    Minna Antis, MD 03/28/20 1023

## 2020-03-27 NOTE — ED Triage Notes (Signed)
Pt here via POV. Pt believes he has sleep apnea, for the past few weeks he has been "tossing and turning" when trying to sleep. When he does fall asleep he wakes up short of breath. Fatigue is interfering with his work schedule and daily living. Pt states he has gained some weight and believes this could be the problem. PT states he was told he may need a referral for a sleep study.  Respirations even and unlabored at this time. Pt back to triage in NAD.

## 2020-03-27 NOTE — Discharge Instructions (Signed)
I have given you the phone number for the sleep study center.  You will likely need a referral from primary care.  Please be sure to keep your appointment with primary care next week so that they can give you this referral.  They will also need to do some more lab work on your blood sugars, as they have been elevated.

## 2020-03-27 NOTE — ED Notes (Signed)
Pt called to be triaged x2, no response by patient.

## 2020-03-27 NOTE — ED Notes (Signed)
Pt called to be triaged, no answer at this time.

## 2020-06-24 ENCOUNTER — Emergency Department: Payer: No Typology Code available for payment source

## 2020-06-24 ENCOUNTER — Other Ambulatory Visit: Payer: Self-pay

## 2020-06-24 DIAGNOSIS — S161XXA Strain of muscle, fascia and tendon at neck level, initial encounter: Secondary | ICD-10-CM | POA: Insufficient documentation

## 2020-06-24 DIAGNOSIS — S39012A Strain of muscle, fascia and tendon of lower back, initial encounter: Secondary | ICD-10-CM | POA: Diagnosis not present

## 2020-06-24 DIAGNOSIS — Y92410 Unspecified street and highway as the place of occurrence of the external cause: Secondary | ICD-10-CM | POA: Diagnosis not present

## 2020-06-24 DIAGNOSIS — S3992XA Unspecified injury of lower back, initial encounter: Secondary | ICD-10-CM | POA: Diagnosis present

## 2020-06-24 DIAGNOSIS — Z87891 Personal history of nicotine dependence: Secondary | ICD-10-CM | POA: Insufficient documentation
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
## 2020-06-25 ENCOUNTER — Emergency Department
Admission: EM | Admit: 2020-06-25 | Discharge: 2020-06-25 | Disposition: A | Discharge status: Home / Self Care | Payer: No Typology Code available for payment source | Attending: Emergency Medicine | Admitting: Emergency Medicine

## 2020-06-25 DIAGNOSIS — S39012A Strain of muscle, fascia and tendon of lower back, initial encounter: Secondary | ICD-10-CM

## 2020-06-25 DIAGNOSIS — S161XXA Strain of muscle, fascia and tendon at neck level, initial encounter: Secondary | ICD-10-CM

## 2020-06-25 MED ORDER — ACETAMINOPHEN 500 MG PO TABS
1000.0000 mg | ORAL_TABLET | Freq: Once | ORAL | Status: AC
  Administered 2020-06-25: 1000 mg via ORAL
  Filled 2020-06-25: qty 2

## 2020-06-25 MED ORDER — KETOROLAC TROMETHAMINE 30 MG/ML IJ SOLN
30.0000 mg | Freq: Once | INTRAMUSCULAR | Status: AC
  Administered 2020-06-25: 30 mg via INTRAMUSCULAR
  Filled 2020-06-25: qty 1

## 2020-06-25 NOTE — Discharge Instructions (Signed)
Please take Tylenol and ibuprofen/Advil for your pain.  It is safe to take them together, or to alternate them every few hours.  Take up to 1000mg of Tylenol at a time, up to 4 times per day.  Do not take more than 4000 mg of Tylenol in 24 hours.  For ibuprofen, take 400-600 mg, 4-5 times per day. ° ° °
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
## 2020-06-25 NOTE — ED Notes (Signed)
Patient discharged to home per MD order. Patient in stable condition, and deemed medically cleared by ED provider for discharge. Discharge instructions reviewed with patient/family using "Teach Back"; verbalized understanding of medication education and administration, and information about follow-up care. Denies further concerns. ° °

## 2020-09-13 ENCOUNTER — Emergency Department: Payer: Self-pay

## 2020-09-13 ENCOUNTER — Other Ambulatory Visit: Payer: Self-pay

## 2020-09-13 ENCOUNTER — Emergency Department
Admission: EM | Admit: 2020-09-13 | Discharge: 2020-09-14 | Disposition: A | Discharge status: Home / Self Care | Payer: Self-pay | Attending: Emergency Medicine | Admitting: Emergency Medicine

## 2020-09-13 DIAGNOSIS — R1031 Right lower quadrant pain: Secondary | ICD-10-CM | POA: Insufficient documentation

## 2020-09-13 DIAGNOSIS — S59912A Unspecified injury of left forearm, initial encounter: Secondary | ICD-10-CM | POA: Diagnosis present

## 2020-09-13 DIAGNOSIS — M6281 Muscle weakness (generalized): Secondary | ICD-10-CM | POA: Insufficient documentation

## 2020-09-13 DIAGNOSIS — S50812A Abrasion of left forearm, initial encounter: Secondary | ICD-10-CM | POA: Insufficient documentation

## 2020-09-13 DIAGNOSIS — M542 Cervicalgia: Secondary | ICD-10-CM | POA: Diagnosis not present

## 2020-09-13 DIAGNOSIS — Z5321 Procedure and treatment not carried out due to patient leaving prior to being seen by health care provider: Secondary | ICD-10-CM | POA: Diagnosis not present

## 2020-09-13 DIAGNOSIS — R1032 Left lower quadrant pain: Secondary | ICD-10-CM | POA: Insufficient documentation

## 2020-09-13 DIAGNOSIS — R519 Headache, unspecified: Secondary | ICD-10-CM | POA: Insufficient documentation

## 2020-09-13 DIAGNOSIS — Y92410 Unspecified street and highway as the place of occurrence of the external cause: Secondary | ICD-10-CM | POA: Insufficient documentation

## 2020-09-13 NOTE — ED Notes (Signed)
Called phone number listed in chart for pt contact. No answer, no voicemail able to be left.
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
## 2020-09-13 NOTE — ED Notes (Addendum)
Pt not in lobby, not in restroom, no outside immediate ed doors. Called pt x1.

## 2020-09-13 NOTE — ED Notes (Signed)
Pt noted to be ambulatory to bathroom in lobby w/o assistance
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
## 2020-09-13 NOTE — ED Notes (Signed)
No answer when called x2, pt not visualized in lobby.
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
## 2020-09-30 ENCOUNTER — Encounter: Payer: Self-pay | Admitting: Emergency Medicine

## 2020-09-30 ENCOUNTER — Other Ambulatory Visit: Payer: Self-pay

## 2020-09-30 DIAGNOSIS — Z87891 Personal history of nicotine dependence: Secondary | ICD-10-CM | POA: Insufficient documentation

## 2020-09-30 DIAGNOSIS — L0211 Cutaneous abscess of neck: Secondary | ICD-10-CM | POA: Insufficient documentation
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
## 2020-10-01 ENCOUNTER — Emergency Department
Admission: EM | Admit: 2020-10-01 | Discharge: 2020-10-01 | Disposition: A | Discharge status: Home / Self Care | Payer: Self-pay | Attending: Emergency Medicine | Admitting: Emergency Medicine

## 2020-10-01 DIAGNOSIS — L0291 Cutaneous abscess, unspecified: Secondary | ICD-10-CM

## 2020-10-01 MED ORDER — SULFAMETHOXAZOLE-TRIMETHOPRIM 800-160 MG PO TABS
1.0000 | ORAL_TABLET | Freq: Two times a day (BID) | ORAL | 0 refills | Status: DC
Start: 2020-10-01 — End: 2021-03-30

## 2020-10-01 MED ORDER — IBUPROFEN 800 MG PO TABS
800.0000 mg | ORAL_TABLET | Freq: Once | ORAL | Status: AC
  Administered 2020-10-01: 800 mg via ORAL
  Filled 2020-10-01: qty 1

## 2020-10-01 MED ORDER — SULFAMETHOXAZOLE-TRIMETHOPRIM 800-160 MG PO TABS
1.0000 | ORAL_TABLET | Freq: Once | ORAL | Status: AC
  Administered 2020-10-01: 1 via ORAL
  Filled 2020-10-01: qty 1

## 2020-10-01 MED ORDER — LIDOCAINE HCL (PF) 1 % IJ SOLN
5.0000 mL | Freq: Once | INTRAMUSCULAR | Status: AC
  Administered 2020-10-01: 5 mL via INTRADERMAL
  Filled 2020-10-01: qty 5

## 2020-10-01 NOTE — ED Provider Notes (Signed)
[facility] Emergency Department Provider Note  ____________________________________________   Event Date/Time   First MD Initiated Contact with Patient 10/01/20 [phone]     (approximate)  I have reviewed the triage vital signs and the nursing notes.   HISTORY  Chief Complaint Abscess    HPI Gregory Sanford is a 30 y.o. male with history of obesity who presents to the emergency department with an abscess to the left neck.  States he thinks it was an ingrown hair.  Was initially draining but now is not.  No systemic symptoms including fevers, nausea, vomiting or diarrhea.  He is not a diabetic.        History reviewed. No pertinent past medical history.  There are no problems to display for this patient.   History reviewed. No pertinent surgical history.  Prior to Admission medications   Medication Sig Start Date End Date Taking? Authorizing Provider  sulfamethoxazole-trimethoprim (BACTRIM DS) 800-160 MG tablet Take 1 tablet by mouth 2 (two) times daily. 10/01/20  Yes Esraa Seres, Layla Maw, DO  famotidine (PEPCID) 20 MG tablet Take 1 tablet (20 mg total) by mouth 2 (two) times daily. 10/09/19 10/01/20  Orvil Feil, PA-C    Allergies Patient has no known allergies.  History reviewed. No pertinent family history.  Social History Social History   Tobacco Use  . Smoking status: Former Games developer  . Smokeless tobacco: Never Used  Substance Use Topics  . Alcohol use: Not Currently  . Drug use: Yes    Types: Marijuana    Review of Systems Constitutional: No fever. Eyes: No visual changes. ENT: No sore throat. Cardiovascular: Denies chest pain. Respiratory: Denies shortness of breath. Gastrointestinal: No nausea, vomiting, diarrhea. Genitourinary: Negative for dysuria. Musculoskeletal: Negative for back pain. Skin: Negative for rash. Neurological: Negative for focal weakness or numbness.  ____________________________________________   PHYSICAL  EXAM:  VITAL SIGNS: ED Triage Vitals  Enc Vitals Group     BP 09/30/20 2347 (!) 149/91     Pulse Rate 09/30/20 2347 86     Resp 09/30/20 2347 16     Temp 09/30/20 2347 98.1 F (36.7 C)     Temp Source 09/30/20 2347 Oral     SpO2 09/30/20 2347 96 %     Weight 09/30/20 2347 299 lb 13.2 oz (136 kg)     Height 09/30/20 2347 5\' 8"  (1.727 m)     Head Circumference --      Peak Flow --      Pain Score 09/30/20 2348 6     Pain Loc --      Pain Edu? --      Excl. in GC? --    CONSTITUTIONAL: Alert and oriented and responds appropriately to questions. Well-appearing; well-nourished HEAD: Normocephalic EYES: Conjunctivae clear, pupils appear equal, EOM appear intact ENT: normal nose; moist mucous membranes, no angioedema, no facial swelling, normal phonation, no trismus, no stridor, no drooling NECK: Supple, normal ROM, 1 x 1 cm abscess noted to the left lateral neck without active drainage or surrounding redness, warmth, induration.  No cervical lymphadenopathy. CARD: RRR; S1 and S2 appreciated; no murmurs, no clicks, no rubs, no gallops RESP: Normal chest excursion without splinting or tachypnea; breath sounds clear and equal bilaterally; no wheezes, no rhonchi, no rales, no hypoxia or respiratory distress, speaking full sentences ABD/GI: Normal bowel sounds; non-distended; soft, non-tender, no rebound, no guarding, no peritoneal signs, no hepatosplenomegaly BACK: The back appears normal EXT: Normal ROM in all joints; no  deformity noted, no edema; no cyanosis SKIN: Normal color for age and race; warm; no rash on exposed skin NEURO: Moves all extremities equally PSYCH: The patient's mood and manner are appropriate.  ____________________________________________   LABS (all labs ordered are listed, but only abnormal results are displayed)  Labs Reviewed - No data to  display ____________________________________________  EKG   ____________________________________________  RADIOLOGY I, Dailey Buccheri, personally viewed and evaluated these images (plain radiographs) as part of my medical decision making, as well as reviewing the written report by the radiologist.  ED MD interpretation:    Official radiology report(s): No results found.  ____________________________________________   PROCEDURES  Procedure(s) performed (including Critical Care):  Procedures  INCISION AND DRAINAGE Performed by: Baxter Hire Tyshan Enderle Consent: Verbal consent obtained. Risks and benefits: risks, benefits and alternatives were discussed Type: abscess  Body area: Left neck  Anesthesia: local infiltration  Incision was made with a scalpel.  Local anesthetic: lidocaine 1% without epinephrine  Anesthetic total: 3 ml  Complexity: Simple, no blunt dissection secondary to location  Drainage: purulent  Drainage amount: Minimal  Packing material: None  Patient tolerance: Patient tolerated the procedure well with no immediate complications.     ____________________________________________   INITIAL IMPRESSION / ASSESSMENT AND PLAN / ED COURSE  As part of my medical decision making, I reviewed the following data within the electronic MEDICAL RECORD NUMBER Nursing notes reviewed and incorporated, Old chart reviewed and Notes from prior ED visits         Patient here with an abscess to the left neck.  No systemic symptoms.  Seems superficial in nature likely from ingrown hair.  Patient agreeable to incision and drainage in the ED. No significant surrounding cellulitis.  ED PROGRESS  Incision and drainage performed at bedside with only very minimal purulent drainage.  I do not appreciate an ingrown hair that needs to be removed at this time.  Will discharge with Bactrim and instructions for warm compresses, soaks to this area.  Recommended alternating Tylenol and  Motrin.  Discussed return precautions.   At this time, I do not feel there is any life-threatening condition present. I have reviewed, interpreted and discussed all results (EKG, imaging, lab, urine as appropriate) and exam findings with patient/family. I have reviewed nursing notes and appropriate previous records.  I feel the patient is safe to be discharged home without further emergent workup and can continue workup as an outpatient as needed. Discussed usual and customary return precautions. Patient/family verbalize understanding and are comfortable with this plan.  Outpatient follow-up has been provided as needed. All questions have been answered.  ____________________________________________   FINAL CLINICAL IMPRESSION(S) / ED DIAGNOSES  Final diagnoses:  Abscess     ED Discharge Orders         Ordered    sulfamethoxazole-trimethoprim (BACTRIM DS) 800-160 MG tablet  2 times daily        10/01/20 8768          *Please note:  Gregory Sanford was evaluated in Emergency Department on 10/01/2020 for the symptoms described in the history of present illness. He was evaluated in the context of the global COVID-19 pandemic, which necessitated consideration that the patient might be at risk for infection with the SARS-CoV-2 virus that causes COVID-19. Institutional protocols and algorithms that pertain to the evaluation of patients at risk for COVID-19 are in a state of rapid change based on information released by regulatory bodies including the CDC and federal and state organizations. These policies  and algorithms were followed during the patient's care in the ED.  Some ED evaluations and interventions may be delayed as a result of limited staffing during and the pandemic.*   Note:  This document was prepared using Dragon voice recognition software and may include unintentional dictation errors.   Lossie Kalp, Layla Maw, DO 10/01/20 [phone]

## 2020-10-01 NOTE — Discharge Instructions (Addendum)
You may alternate Tylenol 1000 mg every 6 hours as needed for pain, fever and Ibuprofen 800 mg every 8 hours as needed for pain, fever.  Please take Ibuprofen with food.  Do not take more than 4000 mg of Tylenol (acetaminophen) in a 24 hour period.   Steps to find a Primary Care Provider (PCP):  Call [phone] or [phone] to access "[facility] Find a Doctor Service."  2.  You may also go on the  website at [email or web address]  

## 2020-10-18 ENCOUNTER — Other Ambulatory Visit: Payer: Self-pay

## 2020-10-18 ENCOUNTER — Emergency Department
Admission: EM | Admit: 2020-10-18 | Discharge: 2020-10-19 | Disposition: A | Discharge status: Home / Self Care | Payer: Self-pay | Attending: Emergency Medicine | Admitting: Emergency Medicine

## 2020-10-18 DIAGNOSIS — N481 Balanitis: Secondary | ICD-10-CM | POA: Insufficient documentation

## 2020-10-18 LAB — URINALYSIS, COMPLETE (UACMP) WITH MICROSCOPIC
Bacteria, UA: NONE SEEN
Bilirubin Urine: NEGATIVE
Glucose, UA: >=500 mg/dL — AB
Hgb urine dipstick: NEGATIVE
Ketones, ur: NEGATIVE mg/dL
Leukocytes,Ua: NEGATIVE
Nitrite: NEGATIVE
Protein, ur: NEGATIVE mg/dL
Specific Gravity, Urine: 1.023 (ref 1.005–1.030)
Squamous Epithelial / HPF: NONE SEEN (ref 0–5)
pH: 7 (ref 5.0–8.0)

## 2020-10-18 MED ORDER — MICONAZOLE NITRATE 2 % EX CREA: TOPICAL_CREAM | CUTANEOUS | 1 refills | Status: DC

## 2020-10-18 NOTE — ED Triage Notes (Signed)
Pt states pain when pulling on skin of penis, slight redness noted to foreskin, no swelling noted to penis on exam, but pt states penis has been swollen at times. Pt denies drainage from penis.

## 2020-10-18 NOTE — ED Provider Notes (Signed)
[facility] Emergency Department Provider Note  ____________________________________________  Time seen: Approximately 11:38 PM  I have reviewed the triage vital signs and the nursing notes.   HISTORY  Chief Complaint Penis Pain   HPI Gregory Sanford is a 30 y.o. male with no significant past medical history who presents for evaluation of pain and swelling of his foreskin.  Patient reports that his symptoms started 2 weeks ago and have been getting progressively worse.  He denies fever chills, dysuria or hematuria, penile discharge, scrotal pain or swelling.   No past medical history on file.  There are no problems to display for this patient.   No past surgical history on file.  Prior to Admission medications   Medication Sig Start Date End Date Taking? Authorizing Provider  miconazole (MICATIN) 2 % cream Apply to affected area 2 times daily for 7 days 10/18/20 10/18/21 Yes Don Perking, [place], MD  sulfamethoxazole-trimethoprim (BACTRIM DS) 800-160 MG tablet Take 1 tablet by mouth 2 (two) times daily. 10/01/20   Ward, Layla Maw, DO  famotidine (PEPCID) 20 MG tablet Take 1 tablet (20 mg total) by mouth 2 (two) times daily. 10/09/19 10/01/20  Orvil Feil, PA-C    Allergies Patient has no known allergies.  No family history on file.  Social History Social History   Tobacco Use  . Smoking status: Former Games developer  . Smokeless tobacco: Never Used  Substance Use Topics  . Alcohol use: Not Currently  . Drug use: Yes    Types: Marijuana    Review of Systems  Constitutional: Negative for fever. Eyes: Negative for visual changes. ENT: Negative for sore throat. Neck: No neck pain  Cardiovascular: Negative for chest pain. Respiratory: Negative for shortness of breath. Gastrointestinal: Negative for abdominal pain, vomiting or diarrhea. Genitourinary: Negative for dysuria. + penile swelling Musculoskeletal: Negative for back pain. Skin: Negative for  rash. Neurological: Negative for headaches, weakness or numbness. Psych: No SI or HI  ____________________________________________   PHYSICAL EXAM:  VITAL SIGNS: ED Triage Vitals  Enc Vitals Group     BP 10/18/20 2100 (!) 154/90     Pulse Rate 10/18/20 2100 89     Resp 10/18/20 2100 16     Temp 10/18/20 2100 97.9 F (36.6 C)     Temp Source 10/18/20 2100 Oral     SpO2 10/18/20 2100 97 %     Weight 10/18/20 2101 300 lb (136.1 kg)     Height 10/18/20 2101 5\' 7"  (1.702 m)     Head Circumference --      Peak Flow --      Pain Score 10/18/20 2101 7     Pain Loc --      Pain Edu? --      Excl. in GC? --     Constitutional: Alert and oriented. Well appearing and in no apparent distress. HEENT:      Head: Normocephalic and atraumatic.         Eyes: Conjunctivae are normal. Sclera is non-icteric.       Mouth/Throat: Mucous membranes are moist.       Neck: Supple with no signs of meningismus. Cardiovascular: Regular rate and rhythm. Respiratory: Normal respiratory effort.  Gastrointestinal: Soft, non tender. Genitourinary: Swelling and erythema of the foreskin with no lesions. Musculoskeletal:  No edema, cyanosis, or erythema of extremities. Neurologic: Normal speech and language. Face is symmetric. Moving all extremities. No gross focal neurologic deficits are appreciated. Skin: Skin is warm, dry and intact.  No rash noted. Psychiatric: Mood and affect are normal. Speech and behavior are normal.  ____________________________________________   LABS (all labs ordered are listed, but only abnormal results are displayed)  Labs Reviewed  URINALYSIS, COMPLETE (UACMP) WITH MICROSCOPIC - Abnormal; Notable for the following components:      Result Value   Color, Urine STRAW (*)    APPearance CLEAR (*)    Glucose, UA >=500 (*)    All other components within normal limits   ____________________________________________  EKG  none   ____________________________________________  RADIOLOGY  none  ____________________________________________   PROCEDURES  Procedure(s) performed: None Procedures Critical Care performed:  None ____________________________________________   INITIAL IMPRESSION / ASSESSMENT AND PLAN / ED COURSE   30 y.o. male with no significant past medical history who presents for evaluation of pain and swelling of his foreskin.  Presentation consistent with balanitis.  Discussed hygiene and topical miconazole with patient for a week.  Discussed my standard return precautions and follow-up with PCP.       _____________________________________________ Please note:  Patient was evaluated in Emergency Department today for the symptoms described in the history of present illness. Patient was evaluated in the context of the global COVID-19 pandemic, which necessitated consideration that the patient might be at risk for infection with the SARS-CoV-2 virus that causes COVID-19. Institutional protocols and algorithms that pertain to the evaluation of patients at risk for COVID-19 are in a state of rapid change based on information released by regulatory bodies including the CDC and federal and state organizations. These policies and algorithms were followed during the patient's care in the ED.  Some ED evaluations and interventions may be delayed as a result of limited staffing during the pandemic.   [place] Controlled Substance Database was reviewed by me. ____________________________________________   FINAL CLINICAL IMPRESSION(S) / ED DIAGNOSES   Final diagnoses:  Balanitis      NEW MEDICATIONS STARTED DURING THIS VISIT:  ED Discharge Orders         Ordered    miconazole (MICATIN) 2 % cream        10/18/20 2334           Note:  This document was prepared using Dragon voice recognition software and may include unintentional dictation errors.    Nita Sickle, MD 10/18/20 [phone]

## 2020-10-18 NOTE — Discharge Instructions (Addendum)
Two or three times a day retract the foreskin, wash it well with warm water and soap. Dry well and apply topical miconazole. Do this for the next 7 days. Return to the ER if the swelling is getting worse.

## 2021-03-30 ENCOUNTER — Emergency Department: Payer: Self-pay

## 2021-03-30 ENCOUNTER — Other Ambulatory Visit: Payer: Self-pay

## 2021-03-30 ENCOUNTER — Emergency Department
Admission: EM | Admit: 2021-03-30 | Discharge: 2021-03-30 | Disposition: A | Discharge status: Home / Self Care | Payer: Self-pay | Attending: Emergency Medicine | Admitting: Emergency Medicine

## 2021-03-30 DIAGNOSIS — J209 Acute bronchitis, unspecified: Secondary | ICD-10-CM | POA: Insufficient documentation

## 2021-03-30 DIAGNOSIS — Z87891 Personal history of nicotine dependence: Secondary | ICD-10-CM | POA: Insufficient documentation

## 2021-03-30 DIAGNOSIS — Z20822 Contact with and (suspected) exposure to covid-19: Secondary | ICD-10-CM | POA: Insufficient documentation

## 2021-03-30 LAB — COMPREHENSIVE METABOLIC PANEL
ALT: 56 U/L — ABNORMAL HIGH (ref 0–44)
AST: 36 U/L (ref 15–41)
Albumin: 3.8 g/dL (ref 3.5–5.0)
Alkaline Phosphatase: 57 U/L (ref 38–126)
Anion gap: 7 (ref 5–15)
BUN: 12 mg/dL (ref 6–20)
CO2: 25 mmol/L (ref 22–32)
Calcium: 8.8 mg/dL — ABNORMAL LOW (ref 8.9–10.3)
Chloride: 105 mmol/L (ref 98–111)
Creatinine, Ser: 0.77 mg/dL (ref 0.61–1.24)
GFR, Estimated: >60 mL/min (ref >60)
Glucose, Bld: 124 mg/dL — ABNORMAL HIGH (ref 70–99)
Potassium: 3.9 mmol/L (ref 3.5–5.1)
Sodium: 137 mmol/L (ref 135–145)
Total Bilirubin: 0.6 mg/dL (ref 0.3–1.2)
Total Protein: 7.1 g/dL (ref 6.5–8.1)

## 2021-03-30 LAB — CBC
HCT: 45.3 % (ref 39.0–52.0)
Hemoglobin: 16.7 g/dL (ref 13.0–17.0)
MCH: 31.1 pg (ref 26.0–34.0)
MCHC: 36.9 g/dL — ABNORMAL HIGH (ref 30.0–36.0)
MCV: 84.4 fL (ref 80.0–100.0)
Platelets: 204 10*3/uL (ref 150–400)
RBC: 5.37 MIL/uL (ref 4.22–5.81)
RDW: 12.8 % (ref 11.5–15.5)
WBC: 9.9 10*3/uL (ref 4.0–10.5)
nRBC: 0 % (ref 0.0–0.2)

## 2021-03-30 LAB — RESP PANEL BY RT-PCR (FLU A&B, COVID) ARPGX2
Influenza A by PCR: NEGATIVE
Influenza B by PCR: NEGATIVE
SARS Coronavirus 2 by RT PCR: NEGATIVE

## 2021-03-30 LAB — TROPONIN I (HIGH SENSITIVITY): Troponin I (High Sensitivity): 7 ng/L (ref <18)

## 2021-03-30 MED ORDER — AZITHROMYCIN 250 MG PO TABS: ORAL_TABLET | ORAL | 0 refills | Status: DC

## 2021-03-30 MED ORDER — PREDNISONE 10 MG (21) PO TBPK: ORAL_TABLET | ORAL | 0 refills | Status: DC

## 2021-03-30 MED ORDER — ALBUTEROL SULFATE (2.5 MG/3ML) 0.083% IN NEBU: 2.5000 mg | INHALATION_SOLUTION | RESPIRATORY_TRACT | Status: DC | PRN

## 2021-03-30 MED ORDER — ALBUTEROL SULFATE HFA 108 (90 BASE) MCG/ACT IN AERS: 2.0000 | INHALATION_SPRAY | RESPIRATORY_TRACT | Status: DC | PRN

## 2021-03-30 MED ORDER — ALBUTEROL SULFATE HFA 108 (90 BASE) MCG/ACT IN AERS: 2.0000 | INHALATION_SPRAY | Freq: Four times a day (QID) | RESPIRATORY_TRACT | 2 refills | Status: AC | PRN

## 2021-03-30 NOTE — ED Triage Notes (Signed)
Pt states that he has been having nasal congestion and has been blowing nose and coughing up mucous, pt states that he was unable to sleep last pm and couldn't catch his breath. Pt states the last time he couldn't catch his breath was a year ago with covid

## 2021-03-30 NOTE — Discharge Instructions (Signed)
Follow-up with your regular doctor if not improving to 3 days.  Return emergency department worsening.  Take medication as prescribed. 

## 2021-03-30 NOTE — ED Notes (Signed)
Patient given discharge instructions, all questions answered. Patient in possession of all belongings, directed to the discharge area  

## 2021-03-30 NOTE — ED Provider Notes (Signed)
[facility] Emergency Department Provider Note  ____________________________________________   Event Date/Time   First MD Initiated Contact with Patient 03/30/21 1155     (approximate)  I have reviewed the triage vital signs and the nursing notes.   HISTORY  Chief Complaint Nasal Congestion and Shortness of Breath    HPI Gregory Sanford is a 30 y.o. male presents emergency department with nasal congestion, cough with green mucus and difficulty catching his breath last night.  States he could not sleep because he got scared about catching his breath.  No fever or chills.  No chest pain.  No past medical history on file.  There are no problems to display for this patient.   No past surgical history on file.  Prior to Admission medications   Medication Sig Start Date End Date Taking? Authorizing Provider  albuterol (VENTOLIN HFA) 108 (90 Base) MCG/ACT inhaler Inhale 2 puffs into the lungs every 6 (six) hours as needed for wheezing or shortness of breath. 03/30/21  Yes Edyn Popoca, Roselyn Bering, PA-C  azithromycin (ZITHROMAX Z-PAK) 250 MG tablet 2 pills today then 1 pill a day for 4 days 03/30/21  Yes Calen Geister, Roselyn Bering, PA-C  predniSONE (STERAPRED UNI-PAK 21 TAB) 10 MG (21) TBPK tablet Take 6 pills on day one then decrease by 1 pill each day 03/30/21  Yes Breena Bevacqua, Roselyn Bering, PA-C  famotidine (PEPCID) 20 MG tablet Take 1 tablet (20 mg total) by mouth 2 (two) times daily. 10/09/19 10/01/20  Orvil Feil, PA-C    Allergies Patient has no known allergies.  No family history on file.  Social History Social History   Tobacco Use   Smoking status: Former   Smokeless tobacco: Never  Substance Use Topics   Alcohol use: Not Currently   Drug use: Yes    Types: Marijuana    Review of Systems  Constitutional: No fever/chills Eyes: No visual changes. ENT: No sore throat. Respiratory: Positive cough Cardiovascular: Denies chest pain Gastrointestinal: Denies abdominal  pain Genitourinary: Negative for dysuria. Musculoskeletal: Negative for back pain. Skin: Negative for rash. Psychiatric: no mood changes,     ____________________________________________   PHYSICAL EXAM:  VITAL SIGNS: ED Triage Vitals  Enc Vitals Group     BP 03/30/21 0955 (!) 146/79     Pulse Rate 03/30/21 0955 91     Resp 03/30/21 0955 18     Temp 03/30/21 0955 98.7 F (37.1 C)     Temp src --      SpO2 03/30/21 0955 97 %     Weight 03/30/21 1011 300 lb (136.1 kg)     Height 03/30/21 1011 5\' 8"  (1.727 m)     Head Circumference --      Peak Flow --      Pain Score 03/30/21 1010 7     Pain Loc --      Pain Edu? --      Excl. in GC? --     Constitutional: Alert and oriented. Well appearing and in no acute distress. Eyes: Conjunctivae are normal.  Head: Atraumatic. Nose: No congestion/rhinnorhea. Mouth/Throat: Mucous membranes are moist.   Neck:  supple no lymphadenopathy noted Cardiovascular: Normal rate, regular rhythm. Heart sounds are normal Respiratory: Normal respiratory effort.  No retractions, lungs c t a  Abd: soft nontender bs normal all 4 quad GU: deferred Musculoskeletal: FROM all extremities, warm and well perfused Neurologic:  Normal speech and language.  Skin:  Skin is warm, dry and intact. No rash  noted. Psychiatric: Mood and affect are normal. Speech and behavior are normal.  ____________________________________________   LABS (all labs ordered are listed, but only abnormal results are displayed)  Labs Reviewed  CBC - Abnormal; Notable for the following components:      Result Value   MCHC 36.9 (*)    All other components within normal limits  COMPREHENSIVE METABOLIC PANEL - Abnormal; Notable for the following components:   Glucose, Bld 124 (*)    Calcium 8.8 (*)    ALT 56 (*)    All other components within normal limits  RESP PANEL BY RT-PCR (FLU A&B, COVID) ARPGX2  TROPONIN I (HIGH SENSITIVITY)  TROPONIN I (HIGH SENSITIVITY)    ____________________________________________   ____________________________________________  RADIOLOGY  Chest x-ray  ____________________________________________   PROCEDURES  Procedure(s) performed: EKG shows normal sinus rhythm, see physician read  Procedures    ____________________________________________   INITIAL IMPRESSION / ASSESSMENT AND PLAN / ED COURSE  Pertinent labs & imaging results that were available during my care of the patient were reviewed by me and considered in my medical decision making (see chart for details).   Patient's 30 year old male presents with cough and difficulty breathing last night.  Not having any difficulty breathing at this time.  See HPI.  Physical exam shows patient be stable.  Able to talk in full sentences.  DDx: COVID, influenza, PNA, bronchitis, PE  I do not feel the patient has a PE as he is not having any difficulty breathing at this time.  Pulses normal and pulse ox is normal.  Chest x-ray reviewed by me confirmed by radiology to be negative.  Therefore the patient is not having pneumonia  Respiratory panel is pending Labs are reassuring, CBC metabolic panel troponin are all normal  Did explain everything to the patient.  He is given prescription for Z-Pak, steroid pack, and albuterol inhaler.  We will call him with his test results later today.  Explained to him if he has COVID he will need to quarantine at home, if it is influenza he can return to work when he is feeling better.  Patient is in agreement treatment plan.  Discharged stable condition.      Gregory Sanford was evaluated in Emergency Department on 03/30/2021 for the symptoms described in the history of present illness. He was evaluated in the context of the global COVID-19 pandemic, which necessitated consideration that the patient might be at risk for infection with the SARS-CoV-2 virus that causes COVID-19. Institutional protocols and algorithms that pertain to  the evaluation of patients at risk for COVID-19 are in a state of rapid change based on information released by regulatory bodies including the CDC and federal and state organizations. These policies and algorithms were followed during the patient's care in the ED.    As part of my medical decision making, I reviewed the following data within the electronic MEDICAL RECORD NUMBER Nursing notes reviewed and incorporated, Labs reviewed , EKG interpreted , Old chart reviewed, Radiograph reviewed , Notes from prior ED visits, and [place] Controlled Substance Database  ____________________________________________   FINAL CLINICAL IMPRESSION(S) / ED DIAGNOSES  Final diagnoses:  Acute bronchitis, unspecified organism      NEW MEDICATIONS STARTED DURING THIS VISIT:  New Prescriptions   ALBUTEROL (VENTOLIN HFA) 108 (90 BASE) MCG/ACT INHALER    Inhale 2 puffs into the lungs every 6 (six) hours as needed for wheezing or shortness of breath.   AZITHROMYCIN (ZITHROMAX Z-PAK) 250 MG TABLET  2 pills today then 1 pill a day for 4 days   PREDNISONE (STERAPRED UNI-PAK 21 TAB) 10 MG (21) TBPK TABLET    Take 6 pills on day one then decrease by 1 pill each day     Note:  This document was prepared using Dragon voice recognition software and may include unintentional dictation errors.    Faythe Ghee, PA-C 03/30/21 1220    Jene Every, MD 03/30/21 1251

## 2021-04-06 ENCOUNTER — Other Ambulatory Visit: Payer: Self-pay

## 2021-04-06 ENCOUNTER — Emergency Department
Admission: EM | Admit: 2021-04-06 | Discharge: 2021-04-06 | Disposition: A | Discharge status: Home / Self Care | Payer: Self-pay | Attending: Emergency Medicine | Admitting: Emergency Medicine

## 2021-04-06 ENCOUNTER — Emergency Department: Payer: Self-pay

## 2021-04-06 DIAGNOSIS — J40 Bronchitis, not specified as acute or chronic: Secondary | ICD-10-CM | POA: Insufficient documentation

## 2021-04-06 DIAGNOSIS — Z87891 Personal history of nicotine dependence: Secondary | ICD-10-CM | POA: Insufficient documentation

## 2021-04-06 DIAGNOSIS — R739 Hyperglycemia, unspecified: Secondary | ICD-10-CM | POA: Insufficient documentation

## 2021-04-06 LAB — CBC
HCT: 47.8 % (ref 39.0–52.0)
Hemoglobin: 16.6 g/dL (ref 13.0–17.0)
MCH: 29.5 pg (ref 26.0–34.0)
MCHC: 34.7 g/dL (ref 30.0–36.0)
MCV: 84.9 fL (ref 80.0–100.0)
Platelets: 220 10*3/uL (ref 150–400)
RBC: 5.63 MIL/uL (ref 4.22–5.81)
RDW: 12.7 % (ref 11.5–15.5)
WBC: 8.4 10*3/uL (ref 4.0–10.5)
nRBC: 0 % (ref 0.0–0.2)

## 2021-04-06 LAB — BASIC METABOLIC PANEL
Anion gap: 8 (ref 5–15)
BUN: 14 mg/dL (ref 6–20)
CO2: 28 mmol/L (ref 22–32)
Calcium: 9.2 mg/dL (ref 8.9–10.3)
Chloride: 101 mmol/L (ref 98–111)
Creatinine, Ser: 0.75 mg/dL (ref 0.61–1.24)
GFR, Estimated: >60 mL/min (ref >60)
Glucose, Bld: 209 mg/dL — ABNORMAL HIGH (ref 70–99)
Potassium: 4 mmol/L (ref 3.5–5.1)
Sodium: 137 mmol/L (ref 135–145)

## 2021-04-06 LAB — TROPONIN I (HIGH SENSITIVITY): Troponin I (High Sensitivity): 8 ng/L (ref <18)

## 2021-04-06 MED ORDER — ALBUTEROL SULFATE HFA 108 (90 BASE) MCG/ACT IN AERS: 2.0000 | INHALATION_SPRAY | Freq: Four times a day (QID) | RESPIRATORY_TRACT | 2 refills | Status: DC | PRN

## 2021-04-06 MED ORDER — ALBUTEROL SULFATE HFA 108 (90 BASE) MCG/ACT IN AERS: 2.0000 | INHALATION_SPRAY | Freq: Once | RESPIRATORY_TRACT | Status: DC

## 2021-04-06 MED ORDER — ALBUTEROL SULFATE (2.5 MG/3ML) 0.083% IN NEBU
2.5000 mg | INHALATION_SOLUTION | Freq: Once | RESPIRATORY_TRACT | Status: AC
  Administered 2021-04-06: 2.5 mg via RESPIRATORY_TRACT
  Filled 2021-04-06: qty 3

## 2021-04-06 NOTE — ED Provider Notes (Signed)
[facility]  ____________________________________________   Event Date/Time   First MD Initiated Contact with Patient 04/06/21 1342     (approximate)  I have reviewed the triage vital signs and the nursing notes.   HISTORY  Chief Complaint Chest Pain and Shortness of Breath    HPI Gregory Sanford is a 30 y.o. male with pmh no past medical history who presents with shortness of breath and chest pain.  Patient was seen about a week ago when his symptoms first started.  He had a chest x-ray that was negative negative blood work and was diagnosed with acute bronchitis.  He was discharged with a Z-Pak, prednisone taper and albuterol inhaler although he was not wheezing at that time.  Patient started taking the medication but then it was thrown out so he was unable to finish it.  He presents today with ongoing symptoms.  He has cough that was initially productive but now is dry.  He feels short of breath especially at nighttime.  He also endorses some chest pain that is pleuritic in nature.  Denies exertional symptoms.  Has not had fevers or chills.  No history of asthma or COPD. The patient denies hx of prior DVT/PE, unilateral leg pain/swelling, hormone use, recent surgery, hx of cancer, prolonged immobilization, or hemoptysis.           No past medical history on file.  There are no problems to display for this patient.   No past surgical history on file.  Prior to Admission medications   Medication Sig Start Date End Date Taking? Authorizing Provider  albuterol (VENTOLIN HFA) 108 (90 Base) MCG/ACT inhaler Inhale 2 puffs into the lungs every 6 (six) hours as needed for wheezing or shortness of breath. 03/30/21   Fisher, Roselyn Bering, PA-C  famotidine (PEPCID) 20 MG tablet Take 1 tablet (20 mg total) by mouth 2 (two) times daily. 10/09/19 10/01/20  Orvil Feil, PA-C    Allergies Patient has no known allergies.  No family history on file.  Social History Social  History   Tobacco Use   Smoking status: Former   Smokeless tobacco: Never  Substance Use Topics   Alcohol use: Not Currently   Drug use: Yes    Types: Marijuana    Review of Systems   Review of Systems  Constitutional:  Negative for chills and fever.  Respiratory:  Positive for cough, chest tightness and shortness of breath.   Cardiovascular:  Positive for chest pain.  Gastrointestinal:  Negative for abdominal pain, nausea and vomiting.  All other systems reviewed and are negative.  Physical Exam Updated Vital Signs BP (!) 160/87 (BP Location: Right Arm)   Pulse 75   Temp 98.7 F (37.1 C) (Oral)   Resp 16   Ht 5\' 8"  (1.727 m)   Wt 136.1 kg   SpO2 99%   BMI 45.61 kg/m   Physical Exam Vitals and nursing note reviewed.  Constitutional:      General: He is not in acute distress.    Appearance: Normal appearance.  HENT:     Head: Normocephalic and atraumatic.  Eyes:     General: No scleral icterus.    Conjunctiva/sclera: Conjunctivae normal.  Cardiovascular:     Heart sounds: Normal heart sounds.  Pulmonary:     Effort: Pulmonary effort is normal. No respiratory distress.     Breath sounds: Normal breath sounds. No wheezing.  Musculoskeletal:        General: No deformity or  signs of injury.     Cervical back: Normal range of motion.  Skin:    Coloration: Skin is not jaundiced or pale.  Neurological:     General: No focal deficit present.     Mental Status: He is alert and oriented to person, place, and time. Mental status is at baseline.  Psychiatric:        Mood and Affect: Mood normal.        Behavior: Behavior normal.     LABS (all labs ordered are listed, but only abnormal results are displayed)  Labs Reviewed  BASIC METABOLIC PANEL - Abnormal; Notable for the following components:      Result Value   Glucose, Bld 209 (*)    All other components within normal limits  CBC  TROPONIN I (HIGH SENSITIVITY)    ____________________________________________  EKG NSR, nml axis, nml intervals, no acute ischemic changes  ____________________________________________  RADIOLOGY Ky Barban, personally viewed and evaluated these images (plain radiographs) as part of my medical decision making, as well as reviewing the written report by the radiologist.  ED MD interpretation:  I reviewed the CXR which does not show any acute cardiopulmonary process      ____________________________________________   PROCEDURES  Procedure(s) performed (including Critical Care):  Procedures   ____________________________________________   INITIAL IMPRESSION / ASSESSMENT AND PLAN / ED COURSE   30 year old male presents with ongoing chest pain shortness of breath and cough for about 1 week.  Initially seen in the ED and was diagnosed with acute bronchitis.  Was prescribed meds that are unfortunately thrown out and he presents today with ongoing symptoms concerned that he has not had the antibiotic.  He is saturating 99% on room air, not tachycardic.  He appears overall well on exam with clear lungs.  Does not appear volume overloaded.  Chest x-ray repeated today does not show any infiltrate cardiomegaly or pulmonary edema.  Labs also obtained which show a negative troponin similar to level 1 week ago.  I do still believe he probably has a viral process causing pleurisy and bronchitis.  Given there is no infiltrate do not feel that antibiotics are indicated.  He is not wheezing so albuterol and Pred unlikely to help.  He is hyperglycemic today with blood sugar in the 200s I especially do not want to prescribe him a steroid.  We will give him albuterol inhaler for symptomatic treatment.  Advised that he follow-up with a primary care provider as he is hyperglycemic here likely has new onset diabetes.     ____________________________________________   FINAL CLINICAL IMPRESSION(S) / ED DIAGNOSES  Final  diagnoses:  Hyperglycemia  Bronchitis     ED Discharge Orders     None        Note:  This document was prepared using Dragon voice recognition software and may include unintentional dictation errors.    Georga Hacking, MD 04/06/21 1401

## 2021-04-06 NOTE — ED Triage Notes (Signed)
Pt reports that he is having Chest pain and SHOB was seen here earlier in the month for the same and was given an antibiotic and did not take it because it was misplaced. He reports that it started to go away but the pain is hurting worse when he takes a deep breath.

## 2021-04-06 NOTE — ED Notes (Signed)
See triage note  presents with some chest discomfort and SOB  state shad similar sxs' about 1 month ago  states pian is worse with inspiration  afebrile on arrival

## 2021-04-06 NOTE — Discharge Instructions (Addendum)
Please follow-up with a primary care physician regarding your elevated blood sugar and your breathing.  You can use the albuterol every 4 hours, 2 to 4 puffs for your breathing.  Please return to the emergency department if your symptoms are worsening.

## 2021-05-17 ENCOUNTER — Emergency Department (HOSPITAL_COMMUNITY): Payer: Self-pay

## 2021-05-17 ENCOUNTER — Encounter (HOSPITAL_COMMUNITY): Payer: Self-pay | Admitting: Emergency Medicine

## 2021-05-17 ENCOUNTER — Emergency Department (HOSPITAL_COMMUNITY)
Admission: EM | Admit: 2021-05-17 | Discharge: 2021-05-17 | Disposition: A | Discharge status: Home / Self Care | Payer: Self-pay | Attending: Emergency Medicine | Admitting: Emergency Medicine

## 2021-05-17 DIAGNOSIS — Y92410 Unspecified street and highway as the place of occurrence of the external cause: Secondary | ICD-10-CM | POA: Diagnosis not present

## 2021-05-17 DIAGNOSIS — G8911 Acute pain due to trauma: Secondary | ICD-10-CM | POA: Insufficient documentation

## 2021-05-17 DIAGNOSIS — R1084 Generalized abdominal pain: Secondary | ICD-10-CM | POA: Insufficient documentation

## 2021-05-17 DIAGNOSIS — S2241XA Multiple fractures of ribs, right side, initial encounter for closed fracture: Secondary | ICD-10-CM

## 2021-05-17 DIAGNOSIS — S8992XA Unspecified injury of left lower leg, initial encounter: Secondary | ICD-10-CM | POA: Diagnosis not present

## 2021-05-17 DIAGNOSIS — Z79899 Other long term (current) drug therapy: Secondary | ICD-10-CM | POA: Insufficient documentation

## 2021-05-17 DIAGNOSIS — Z23 Encounter for immunization: Secondary | ICD-10-CM | POA: Diagnosis not present

## 2021-05-17 DIAGNOSIS — R0789 Other chest pain: Secondary | ICD-10-CM | POA: Diagnosis not present

## 2021-05-17 DIAGNOSIS — S79912A Unspecified injury of left hip, initial encounter: Secondary | ICD-10-CM | POA: Insufficient documentation

## 2021-05-17 DIAGNOSIS — Z20822 Contact with and (suspected) exposure to covid-19: Secondary | ICD-10-CM | POA: Insufficient documentation

## 2021-05-17 DIAGNOSIS — Z87891 Personal history of nicotine dependence: Secondary | ICD-10-CM | POA: Insufficient documentation

## 2021-05-17 LAB — RAPID URINE DRUG SCREEN, HOSP PERFORMED
Amphetamines: NOT DETECTED
Barbiturates: NOT DETECTED
Benzodiazepines: NOT DETECTED
Cocaine: NOT DETECTED
Opiates: NOT DETECTED
Tetrahydrocannabinol: POSITIVE — AB

## 2021-05-17 LAB — I-STAT CHEM 8, ED
BUN: 11 mg/dL (ref 6–20)
Calcium, Ion: 1.14 mmol/L — ABNORMAL LOW (ref 1.15–1.40)
Chloride: 100 mmol/L (ref 98–111)
Creatinine, Ser: 0.9 mg/dL (ref 0.61–1.24)
Glucose, Bld: 221 mg/dL — ABNORMAL HIGH (ref 70–99)
HCT: 50 % (ref 39.0–52.0)
Hemoglobin: 17 g/dL (ref 13.0–17.0)
Potassium: 3.6 mmol/L (ref 3.5–5.1)
Sodium: 136 mmol/L (ref 135–145)
TCO2: 27 mmol/L (ref 22–32)

## 2021-05-17 LAB — URINALYSIS, ROUTINE W REFLEX MICROSCOPIC
Bilirubin Urine: NEGATIVE
Glucose, UA: 250 mg/dL — AB
Hgb urine dipstick: NEGATIVE
Ketones, ur: NEGATIVE mg/dL
Leukocytes,Ua: NEGATIVE
Nitrite: NEGATIVE
Protein, ur: 30 mg/dL — AB
Specific Gravity, Urine: 1.015 (ref 1.005–1.030)
pH: 6 (ref 5.0–8.0)

## 2021-05-17 LAB — URINALYSIS, MICROSCOPIC (REFLEX)

## 2021-05-17 LAB — CBC
HCT: 47.7 % (ref 39.0–52.0)
Hemoglobin: 16.7 g/dL (ref 13.0–17.0)
MCH: 29.8 pg (ref 26.0–34.0)
MCHC: 35 g/dL (ref 30.0–36.0)
MCV: 85 fL (ref 80.0–100.0)
Platelets: 178 10*3/uL (ref 150–400)
RBC: 5.61 MIL/uL (ref 4.22–5.81)
RDW: 12.5 % (ref 11.5–15.5)
WBC: 13.8 10*3/uL — ABNORMAL HIGH (ref 4.0–10.5)
nRBC: 0 % (ref 0.0–0.2)

## 2021-05-17 LAB — COMPREHENSIVE METABOLIC PANEL
ALT: 182 U/L — ABNORMAL HIGH (ref 0–44)
AST: 172 U/L — ABNORMAL HIGH (ref 15–41)
Albumin: 3.6 g/dL (ref 3.5–5.0)
Alkaline Phosphatase: 65 U/L (ref 38–126)
Anion gap: 8 (ref 5–15)
BUN: 10 mg/dL (ref 6–20)
CO2: 28 mmol/L (ref 22–32)
Calcium: 9.4 mg/dL (ref 8.9–10.3)
Chloride: 101 mmol/L (ref 98–111)
Creatinine, Ser: 1.05 mg/dL (ref 0.61–1.24)
GFR, Estimated: >60 mL/min (ref >60)
Glucose, Bld: 224 mg/dL — ABNORMAL HIGH (ref 70–99)
Potassium: 3.9 mmol/L (ref 3.5–5.1)
Sodium: 137 mmol/L (ref 135–145)
Total Bilirubin: 0.7 mg/dL (ref 0.3–1.2)
Total Protein: 6.8 g/dL (ref 6.5–8.1)

## 2021-05-17 LAB — RESP PANEL BY RT-PCR (FLU A&B, COVID) ARPGX2
Influenza A by PCR: NEGATIVE
Influenza B by PCR: NEGATIVE
SARS Coronavirus 2 by RT PCR: NEGATIVE

## 2021-05-17 LAB — LACTIC ACID, PLASMA: Lactic Acid, Venous: 2.1 mmol/L (ref 0.5–1.9)

## 2021-05-17 LAB — PROTIME-INR
INR: 1 (ref 0.8–1.2)
Prothrombin Time: 13.5 seconds (ref 11.4–15.2)

## 2021-05-17 LAB — SAMPLE TO BLOOD BANK

## 2021-05-17 LAB — ETHANOL: Alcohol, Ethyl (B): <10 mg/dL (ref <10)

## 2021-05-17 MED ORDER — HYDROCODONE-ACETAMINOPHEN 5-325 MG PO TABS: 1.0000 | ORAL_TABLET | Freq: Four times a day (QID) | ORAL | 0 refills | Status: DC | PRN

## 2021-05-17 MED ORDER — IOHEXOL 300 MG/ML  SOLN
100.0000 mL | Freq: Once | INTRAMUSCULAR | Status: AC | PRN
  Administered 2021-05-17: 17:00:00 100 mL via INTRAVENOUS

## 2021-05-17 MED ORDER — TETANUS-DIPHTH-ACELL PERTUSSIS 5-2.5-18.5 LF-MCG/0.5 IM SUSY
0.5000 mL | PREFILLED_SYRINGE | Freq: Once | INTRAMUSCULAR | Status: AC
  Administered 2021-05-17: 16:00:00 0.5 mL via INTRAMUSCULAR
  Filled 2021-05-17: qty 0.5

## 2021-05-17 MED ORDER — KETOROLAC TROMETHAMINE 30 MG/ML IJ SOLN
30.0000 mg | Freq: Once | INTRAMUSCULAR | Status: AC
  Administered 2021-05-17: 20:00:00 30 mg via INTRAVENOUS
  Filled 2021-05-17: qty 1

## 2021-05-17 MED ORDER — IBUPROFEN 800 MG PO TABS: 800.0000 mg | ORAL_TABLET | Freq: Three times a day (TID) | ORAL | 0 refills | Status: AC | PRN

## 2021-05-17 MED ORDER — HYDROMORPHONE HCL 1 MG/ML IJ SOLN
1.0000 mg | Freq: Once | INTRAMUSCULAR | Status: AC
  Administered 2021-05-17: 16:00:00 1 mg via INTRAVENOUS
  Filled 2021-05-17: qty 1

## 2021-05-17 NOTE — ED Notes (Signed)
Date and time results received: 05/17/21 4:59 PM  (use smartphrase ".now" to insert current time)  Test: Lactic Critical Value: 2.1  Name of Provider Notified: Dr. Bernette Mayers  Orders Received? Or Actions Taken?: see chart
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
## 2021-05-17 NOTE — ED Notes (Signed)
Pt transported to xray 

## 2021-05-17 NOTE — ED Notes (Signed)
ER MD at bedside
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
## 2021-06-01 ENCOUNTER — Emergency Department
Admission: EM | Admit: 2021-06-01 | Discharge: 2021-06-01 | Disposition: A | Discharge status: Home / Self Care | Payer: Self-pay | Attending: Emergency Medicine | Admitting: Emergency Medicine

## 2021-06-01 ENCOUNTER — Encounter: Payer: Self-pay | Admitting: Radiology

## 2021-06-01 ENCOUNTER — Other Ambulatory Visit: Payer: Self-pay

## 2021-06-01 ENCOUNTER — Emergency Department: Payer: Self-pay

## 2021-06-01 DIAGNOSIS — R059 Cough, unspecified: Secondary | ICD-10-CM | POA: Insufficient documentation

## 2021-06-01 DIAGNOSIS — Y92410 Unspecified street and highway as the place of occurrence of the external cause: Secondary | ICD-10-CM | POA: Diagnosis not present

## 2021-06-01 DIAGNOSIS — S299XXD Unspecified injury of thorax, subsequent encounter: Secondary | ICD-10-CM | POA: Diagnosis present

## 2021-06-01 DIAGNOSIS — S2243XD Multiple fractures of ribs, bilateral, subsequent encounter for fracture with routine healing: Secondary | ICD-10-CM | POA: Diagnosis not present

## 2021-06-01 DIAGNOSIS — R0789 Other chest pain: Secondary | ICD-10-CM | POA: Insufficient documentation

## 2021-06-01 DIAGNOSIS — S2241XD Multiple fractures of ribs, right side, subsequent encounter for fracture with routine healing: Secondary | ICD-10-CM

## 2021-06-01 LAB — COMPREHENSIVE METABOLIC PANEL
ALT: 56 U/L — ABNORMAL HIGH (ref 0–44)
AST: 30 U/L (ref 15–41)
Albumin: 3.7 g/dL (ref 3.5–5.0)
Alkaline Phosphatase: 120 U/L (ref 38–126)
Anion gap: 7 (ref 5–15)
BUN: 12 mg/dL (ref 6–20)
CO2: 25 mmol/L (ref 22–32)
Calcium: 8.7 mg/dL — ABNORMAL LOW (ref 8.9–10.3)
Chloride: 101 mmol/L (ref 98–111)
Creatinine, Ser: 0.69 mg/dL (ref 0.61–1.24)
GFR, Estimated: >60 mL/min (ref >60)
Glucose, Bld: 220 mg/dL — ABNORMAL HIGH (ref 70–99)
Potassium: 3.6 mmol/L (ref 3.5–5.1)
Sodium: 133 mmol/L — ABNORMAL LOW (ref 135–145)
Total Bilirubin: 0.7 mg/dL (ref 0.3–1.2)
Total Protein: 7.4 g/dL (ref 6.5–8.1)

## 2021-06-01 LAB — CBC WITH DIFFERENTIAL/PLATELET
Abs Immature Granulocytes: 0.06 10*3/uL (ref 0.00–0.07)
Basophils Absolute: 0.1 10*3/uL (ref 0.0–0.1)
Basophils Relative: 1 %
Eosinophils Absolute: 0.1 10*3/uL (ref 0.0–0.5)
Eosinophils Relative: 2 %
HCT: 41 % (ref 39.0–52.0)
Hemoglobin: 14.5 g/dL (ref 13.0–17.0)
Immature Granulocytes: 1 %
Lymphocytes Relative: 21 %
Lymphs Abs: 1.4 10*3/uL (ref 0.7–4.0)
MCH: 30.2 pg (ref 26.0–34.0)
MCHC: 35.4 g/dL (ref 30.0–36.0)
MCV: 85.4 fL (ref 80.0–100.0)
Monocytes Absolute: 0.7 10*3/uL (ref 0.1–1.0)
Monocytes Relative: 9 %
Neutro Abs: 4.7 10*3/uL (ref 1.7–7.7)
Neutrophils Relative %: 66 %
Platelets: 222 10*3/uL (ref 150–400)
RBC: 4.8 MIL/uL (ref 4.22–5.81)
RDW: 12.9 % (ref 11.5–15.5)
WBC: 7 10*3/uL (ref 4.0–10.5)
nRBC: 0 % (ref 0.0–0.2)

## 2021-06-01 LAB — TROPONIN I (HIGH SENSITIVITY)
Troponin I (High Sensitivity): 7 ng/L (ref <18)
Troponin I (High Sensitivity): 6 ng/L (ref <18)

## 2021-06-01 LAB — LIPASE, BLOOD: Lipase: 36 U/L (ref 11–51)

## 2021-06-01 LAB — D-DIMER, QUANTITATIVE: D-Dimer, Quant: 2.36 ug/mL-FEU — ABNORMAL HIGH (ref 0.00–0.50)

## 2021-06-01 MED ORDER — IOHEXOL 350 MG/ML SOLN
100.0000 mL | Freq: Once | INTRAVENOUS | Status: AC | PRN
  Administered 2021-06-01: 100 mL via INTRAVENOUS
  Filled 2021-06-01: qty 100

## 2021-06-01 MED ORDER — KETOROLAC TROMETHAMINE 30 MG/ML IJ SOLN: 15.0000 mg | Freq: Once | INTRAMUSCULAR | Status: DC

## 2021-06-01 MED ORDER — OXYCODONE-ACETAMINOPHEN 5-325 MG PO TABS: 1.0000 | ORAL_TABLET | Freq: Four times a day (QID) | ORAL | 0 refills | Status: AC | PRN

## 2021-06-01 MED ORDER — ONDANSETRON HCL 4 MG/2ML IJ SOLN
4.0000 mg | Freq: Once | INTRAMUSCULAR | Status: AC
  Administered 2021-06-01: 4 mg via INTRAVENOUS
  Filled 2021-06-01: qty 2

## 2021-06-01 MED ORDER — HYDROMORPHONE HCL 1 MG/ML IJ SOLN
1.0000 mg | Freq: Once | INTRAMUSCULAR | Status: AC
Start: 2021-06-01 — End: 2021-06-01
  Administered 2021-06-01: 1 mg via INTRAVENOUS
  Filled 2021-06-01: qty 1

## 2021-06-01 MED ORDER — KETOROLAC TROMETHAMINE 30 MG/ML IJ SOLN
30.0000 mg | Freq: Once | INTRAMUSCULAR | Status: AC
  Administered 2021-06-01: 30 mg via INTRAVENOUS
  Filled 2021-06-01: qty 1

## 2021-06-01 MED ORDER — NAPROXEN 500 MG PO TABS
500.0000 mg | ORAL_TABLET | Freq: Two times a day (BID) | ORAL | 0 refills | Status: AC
Start: 2021-06-01 — End: 2021-06-08
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
## 2021-06-01 NOTE — ED Triage Notes (Signed)
See paper chart for downtime 

## 2021-06-01 NOTE — Discharge Instructions (Addendum)
For your rib fracture:  Take the Naproxen twice a day with meals Take the Percocet for severe pain. DO NOT DRINK OR SLEEP after taking this medicine.  You can try TYLENOL between Percocet for pain as well. Do not take more than 4000 mg of tylenol total per day.  CALL ONE OF THE PRIMARY CARE NUMBERS TO SET UP AN APPOINTMENT FOR YOUR SLEEP APNEA  I'D RECOMMEND SLEEPING IN A RECLINER/CHAIR OR WITH MULTIPLE PILLOWS PROPPING YOUR HEAD/NECK UP UNTIL THEN, TO HELP WITH OBSTRUCTION/APNEA

## 2021-06-01 NOTE — ED Triage Notes (Signed)
Pt in with co chest pain since this am, pt denies any cardiac history.

## 2021-06-01 NOTE — ED Notes (Signed)
Ambulated in hall.  Pulse ox is 90s, and goes from low 90s to upper 90s with the walk.  Resp 30s to low 40s and hr went to 117.
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
## 2021-06-09 ENCOUNTER — Encounter: Payer: Self-pay | Admitting: Emergency Medicine

## 2021-06-09 ENCOUNTER — Inpatient Hospital Stay
Admission: EM | Admit: 2021-06-09 | Discharge: 2021-06-10 | DRG: 872 | Discharge status: Against Medical Advice | Payer: Self-pay | Attending: Family Medicine | Admitting: Family Medicine

## 2021-06-09 ENCOUNTER — Emergency Department: Payer: Self-pay

## 2021-06-09 ENCOUNTER — Other Ambulatory Visit: Payer: Self-pay

## 2021-06-09 DIAGNOSIS — R0602 Shortness of breath: Secondary | ICD-10-CM

## 2021-06-09 DIAGNOSIS — A419 Sepsis, unspecified organism: Principal | ICD-10-CM | POA: Diagnosis present

## 2021-06-09 DIAGNOSIS — Z20822 Contact with and (suspected) exposure to covid-19: Secondary | ICD-10-CM | POA: Diagnosis present

## 2021-06-09 LAB — CBC WITH DIFFERENTIAL/PLATELET
Abs Immature Granulocytes: 0.1 10*3/uL — ABNORMAL HIGH (ref 0.00–0.07)
Basophils Absolute: 0.1 10*3/uL (ref 0.0–0.1)
Basophils Relative: 1 %
Eosinophils Absolute: 0.1 10*3/uL (ref 0.0–0.5)
Eosinophils Relative: 0 %
HCT: 43.5 % (ref 39.0–52.0)
Hemoglobin: 15.3 g/dL (ref 13.0–17.0)
Immature Granulocytes: 1 %
Lymphocytes Relative: 5 %
Lymphs Abs: 0.9 10*3/uL (ref 0.7–4.0)
MCH: 29.8 pg (ref 26.0–34.0)
MCHC: 35.2 g/dL (ref 30.0–36.0)
MCV: 84.8 fL (ref 80.0–100.0)
Monocytes Absolute: 1.2 10*3/uL — ABNORMAL HIGH (ref 0.1–1.0)
Monocytes Relative: 7 %
Neutro Abs: 15.1 10*3/uL — ABNORMAL HIGH (ref 1.7–7.7)
Neutrophils Relative %: 86 %
Platelets: 179 10*3/uL (ref 150–400)
RBC: 5.13 MIL/uL (ref 4.22–5.81)
RDW: 12.8 % (ref 11.5–15.5)
WBC: 17.4 10*3/uL — ABNORMAL HIGH (ref 4.0–10.5)
nRBC: 0 % (ref 0.0–0.2)

## 2021-06-09 LAB — COMPREHENSIVE METABOLIC PANEL
ALT: 62 U/L — ABNORMAL HIGH (ref 0–44)
AST: 34 U/L (ref 15–41)
Albumin: 4.4 g/dL (ref 3.5–5.0)
Alkaline Phosphatase: 118 U/L (ref 38–126)
Anion gap: 7 (ref 5–15)
BUN: 10 mg/dL (ref 6–20)
CO2: 26 mmol/L (ref 22–32)
Calcium: 9.1 mg/dL (ref 8.9–10.3)
Chloride: 99 mmol/L (ref 98–111)
Creatinine, Ser: 0.71 mg/dL (ref 0.61–1.24)
GFR, Estimated: >60 mL/min (ref >60)
Glucose, Bld: 164 mg/dL — ABNORMAL HIGH (ref 70–99)
Potassium: 4.1 mmol/L (ref 3.5–5.1)
Sodium: 132 mmol/L — ABNORMAL LOW (ref 135–145)
Total Bilirubin: 0.9 mg/dL (ref 0.3–1.2)
Total Protein: 7.8 g/dL (ref 6.5–8.1)

## 2021-06-09 LAB — TROPONIN I (HIGH SENSITIVITY)
Troponin I (High Sensitivity): 5 ng/L (ref <18)
Troponin I (High Sensitivity): 5 ng/L (ref <18)

## 2021-06-09 LAB — BRAIN NATRIURETIC PEPTIDE: B Natriuretic Peptide: 20.3 pg/mL (ref 0.0–100.0)

## 2021-06-09 LAB — LACTIC ACID, PLASMA: Lactic Acid, Venous: 1.9 mmol/L (ref 0.5–1.9)

## 2021-06-09 MED ORDER — SODIUM CHLORIDE 0.9 % IV BOLUS
1000.0000 mL | Freq: Once | INTRAVENOUS | Status: AC
  Administered 2021-06-09: 1000 mL via INTRAVENOUS

## 2021-06-09 MED ORDER — IOHEXOL 350 MG/ML SOLN
125.0000 mL | Freq: Once | INTRAVENOUS | Status: AC | PRN
  Administered 2021-06-09: 125 mL via INTRAVENOUS
  Filled 2021-06-09: qty 125

## 2021-06-09 MED ORDER — VANCOMYCIN HCL 2000 MG/400ML IV SOLN
2000.0000 mg | Freq: Once | INTRAVENOUS | Status: DC
  Filled 2021-06-09: qty 400

## 2021-06-09 MED ORDER — SODIUM CHLORIDE 0.9 % IV SOLN
2.0000 g | Freq: Once | INTRAVENOUS | Status: AC
  Administered 2021-06-09: 2 g via INTRAVENOUS
  Filled 2021-06-09: qty 2
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
## 2021-06-10 DIAGNOSIS — A419 Sepsis, unspecified organism: Secondary | ICD-10-CM | POA: Diagnosis present

## 2021-06-10 LAB — RESP PANEL BY RT-PCR (FLU A&B, COVID) ARPGX2
Influenza A by PCR: NEGATIVE
Influenza B by PCR: NEGATIVE
SARS Coronavirus 2 by RT PCR: NEGATIVE

## 2021-06-10 NOTE — ED Notes (Addendum)
Pt not in room   iv's were d/ced by pt lying on the floor.  Dr Arville Care discovered pt had eloped from room and left.  Pt called, no answer.  Pt left without admission.

## 2021-06-10 NOTE — Progress Notes (Signed)
I was asked to evaluate this patient to be admitted and when I went to see the patient within 25 minutes he was not found in the room.  It was noted that he ripped out his IVs, eloped and left.

## 2021-06-14 LAB — CULTURE, BLOOD (ROUTINE X 2): Culture: NO GROWTH

## 2022-01-30 IMAGING — CR DG CERVICAL SPINE 2 OR 3 VIEWS
1 series · 4 of 4 positions shown · non-contrast
Comparison: None.

CLINICAL DATA: Pain

EXAM:
CERVICAL SPINE - 2-3 VIEW

[Series 1: dg cervical spine 2 or 3 views · 0.14mm/px · 4 of 4 slices shown]
[im 1/4]
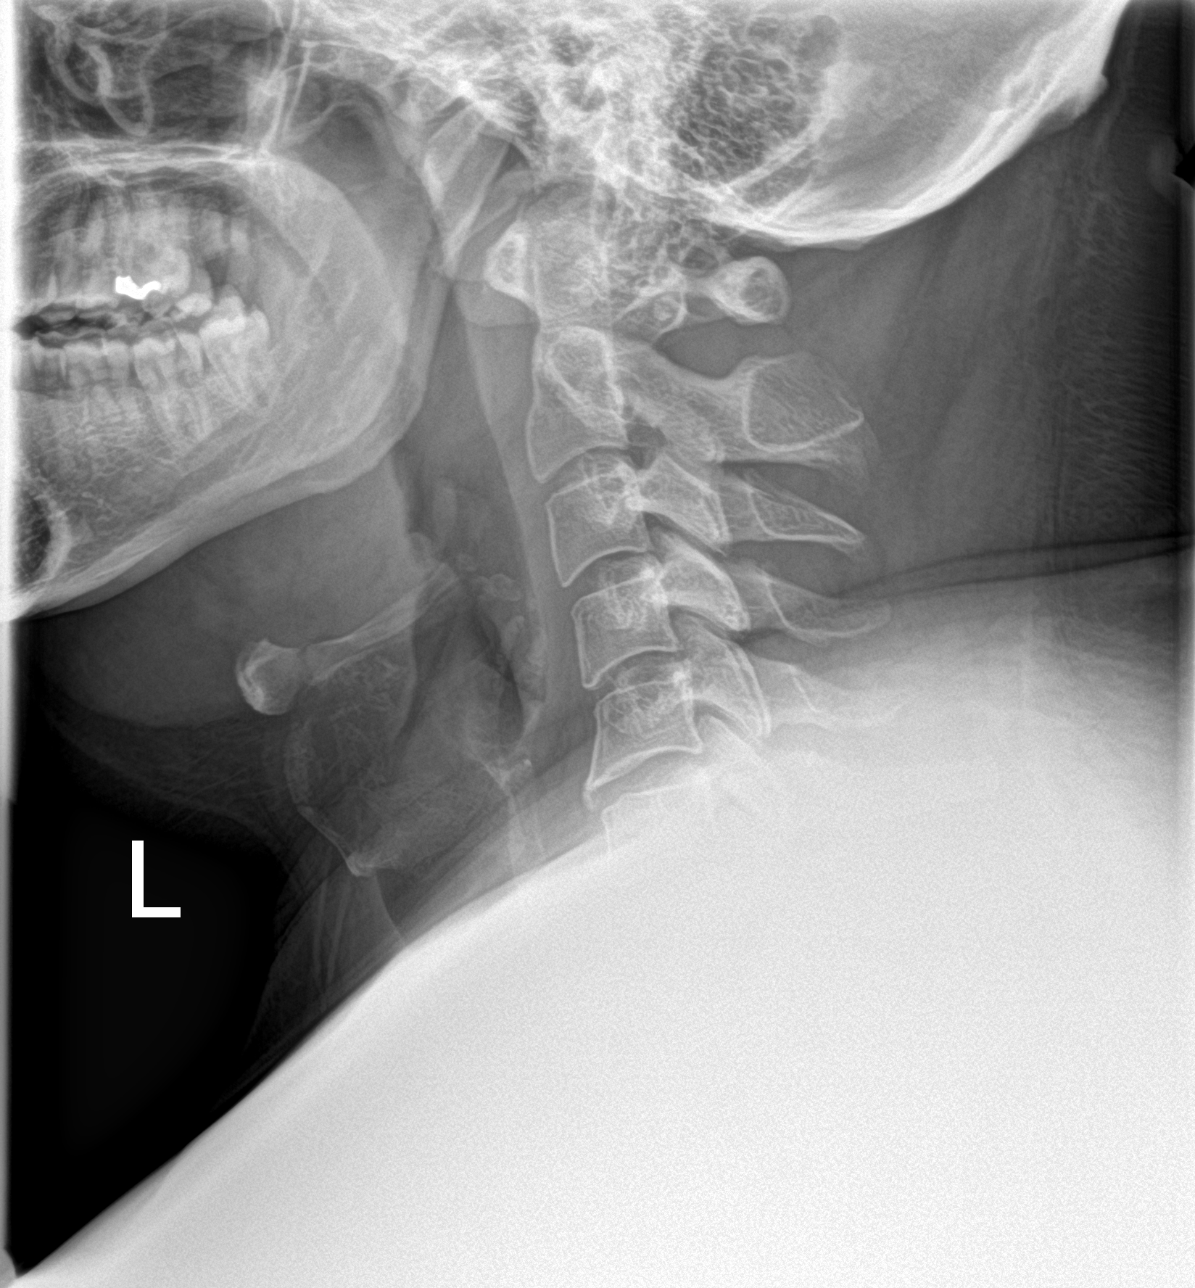
[im 2/4]
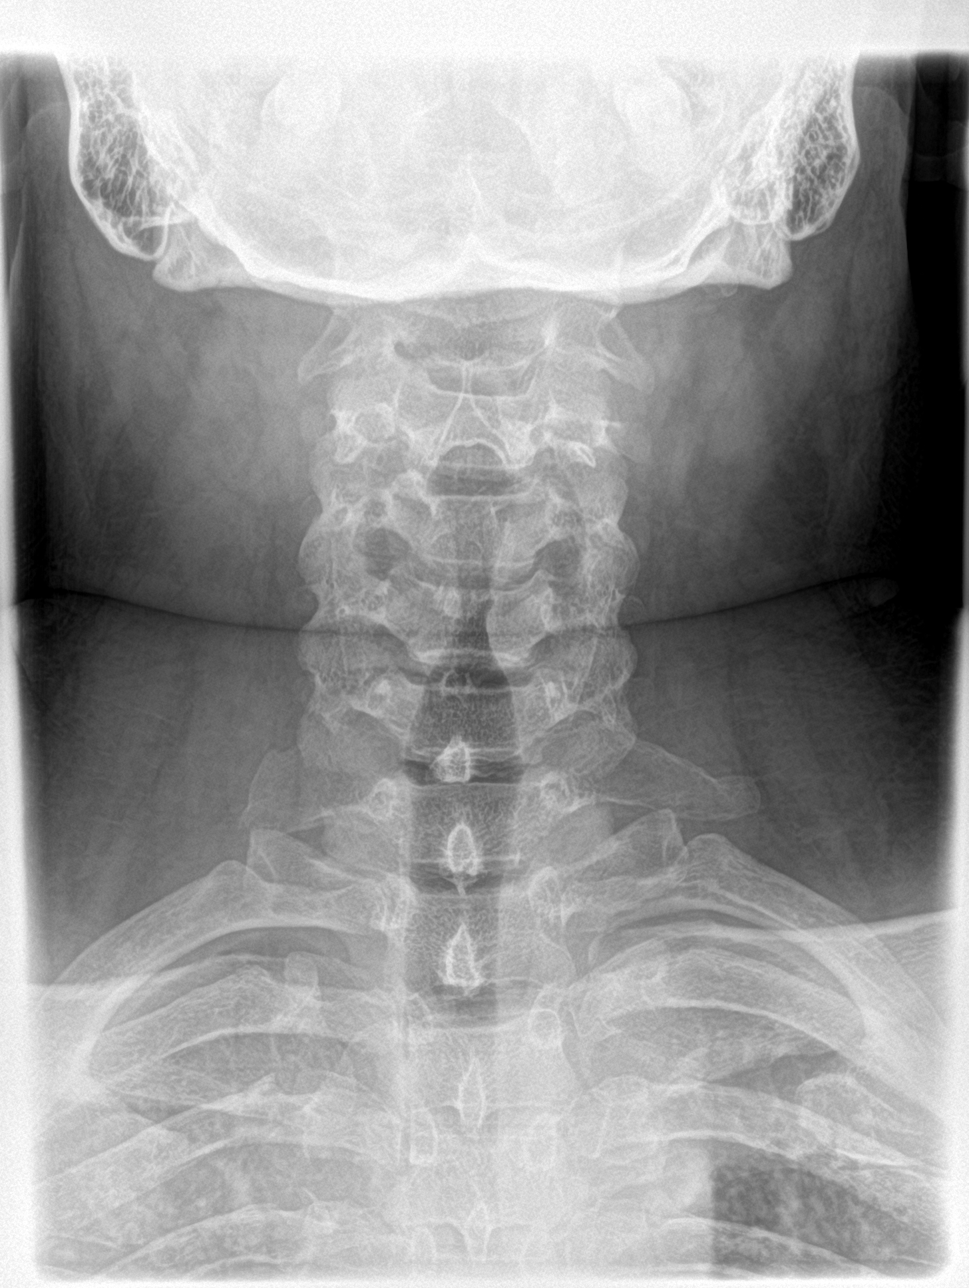
[im 3/4]
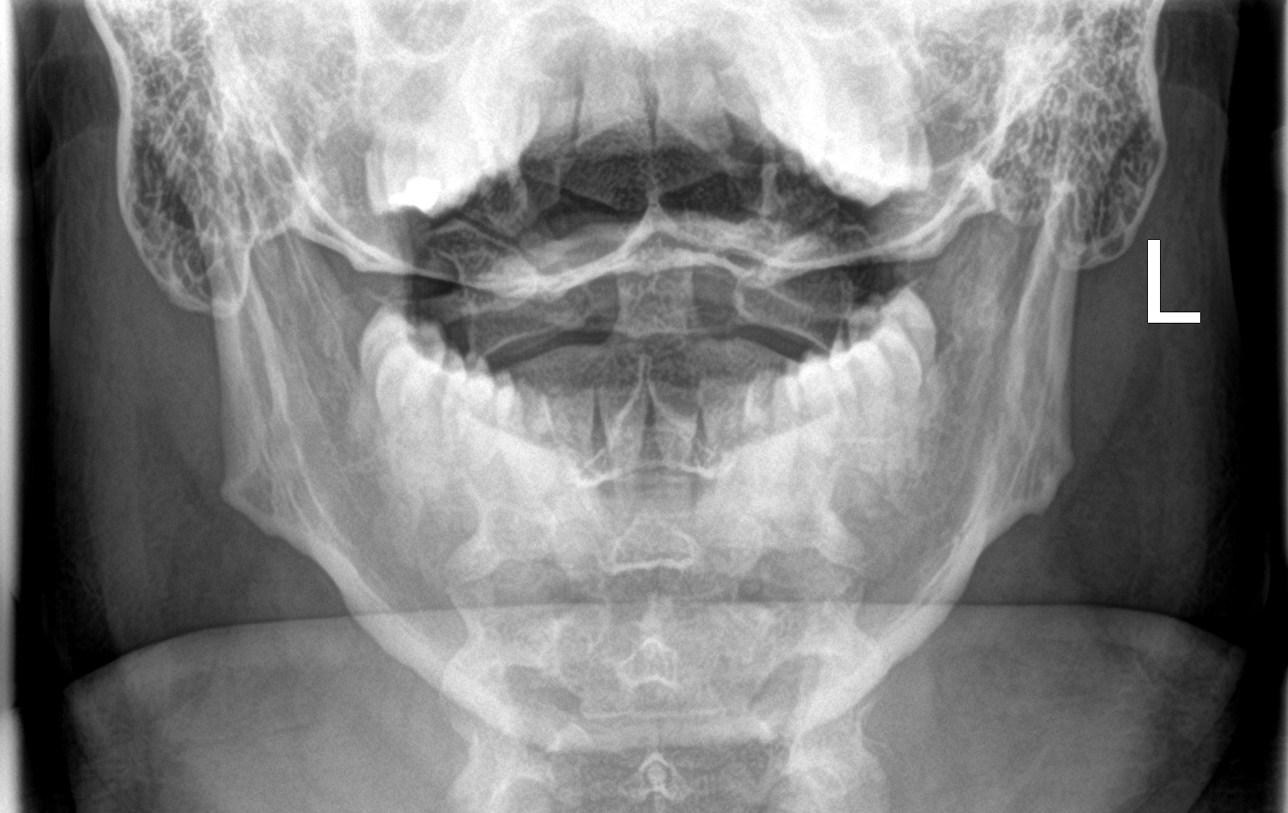
[im 4/4]
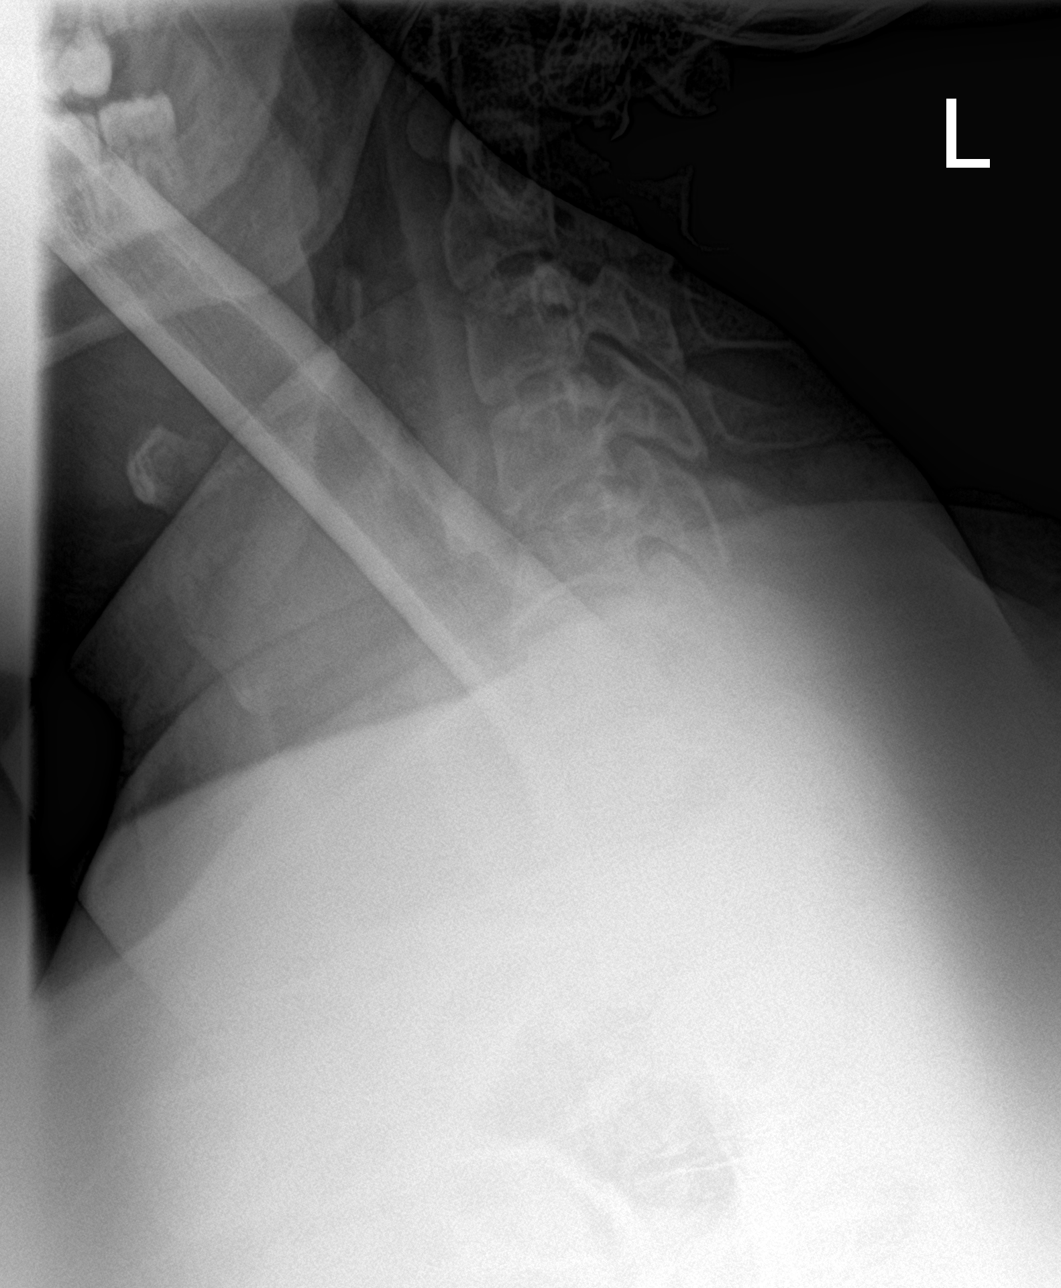

[4 of 4 positions shown; findings below may reference images not displayed]

FINDINGS: There is no evidence of cervical spine fracture or prevertebral soft
tissue swelling. Alignment is normal. No other significant bone
abnormalities are identified. There is a prominent C7 transverse
process on the left.
IMPRESSION: Negative cervical spine radiographs.

## 2022-01-30 IMAGING — CR DG THORACIC SPINE 2V
1 series · 3 of 3 positions shown · non-contrast
Comparison: None.

CLINICAL DATA: Neck pain.

EXAM:
THORACIC SPINE 2 VIEWS

[Series 1: dg thoracic spine 2 view · 0.14mm/px · 3 of 3 slices shown]
[im 1/3]
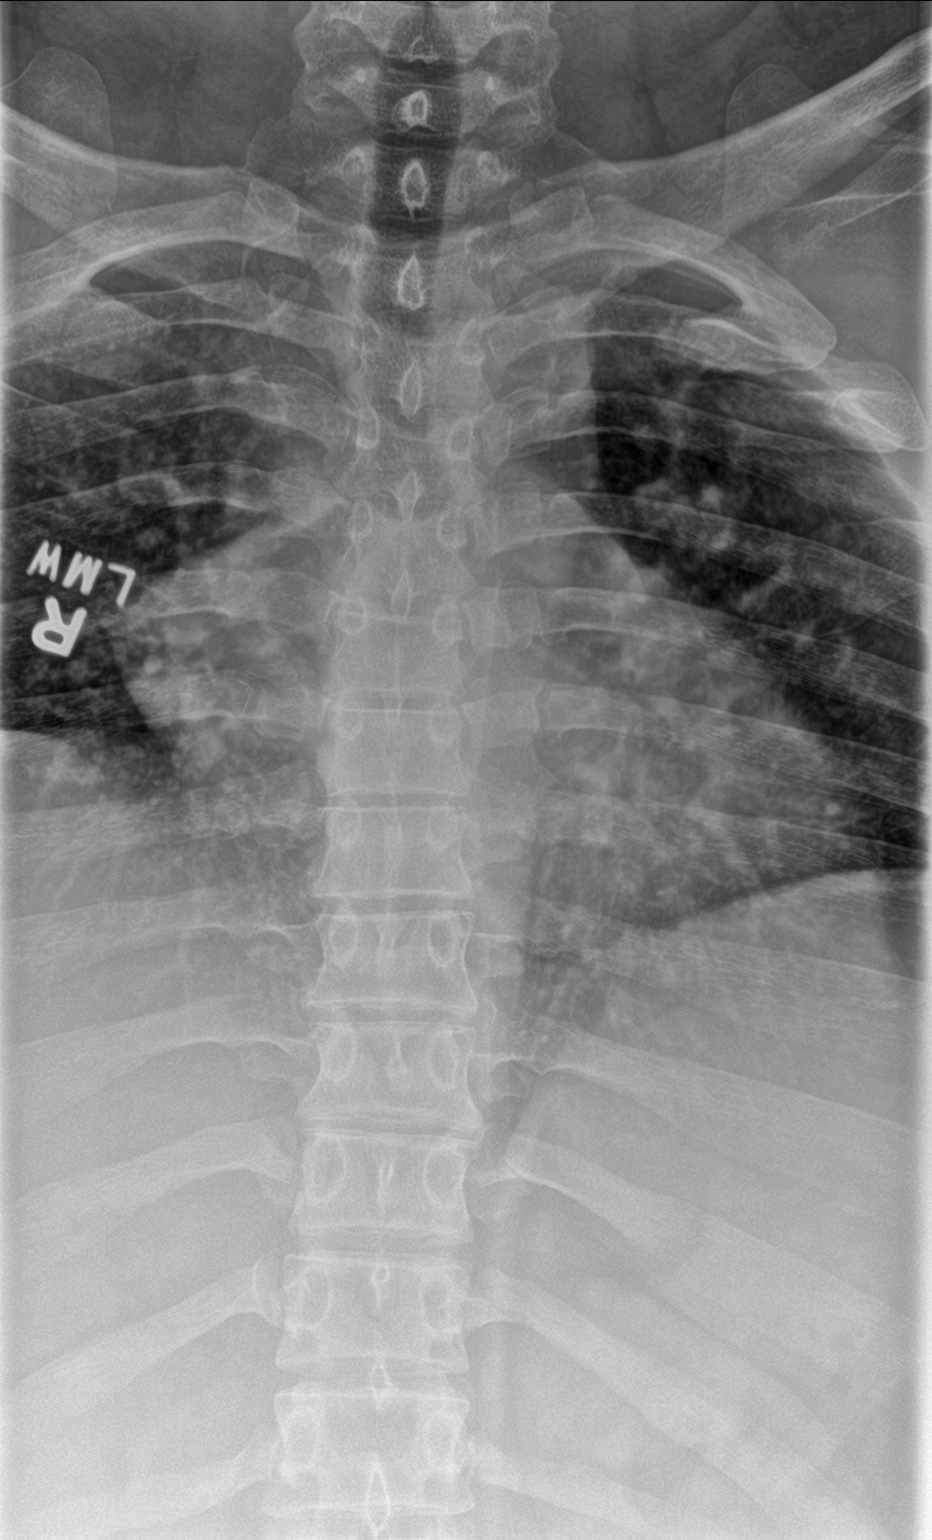
[im 2/3]
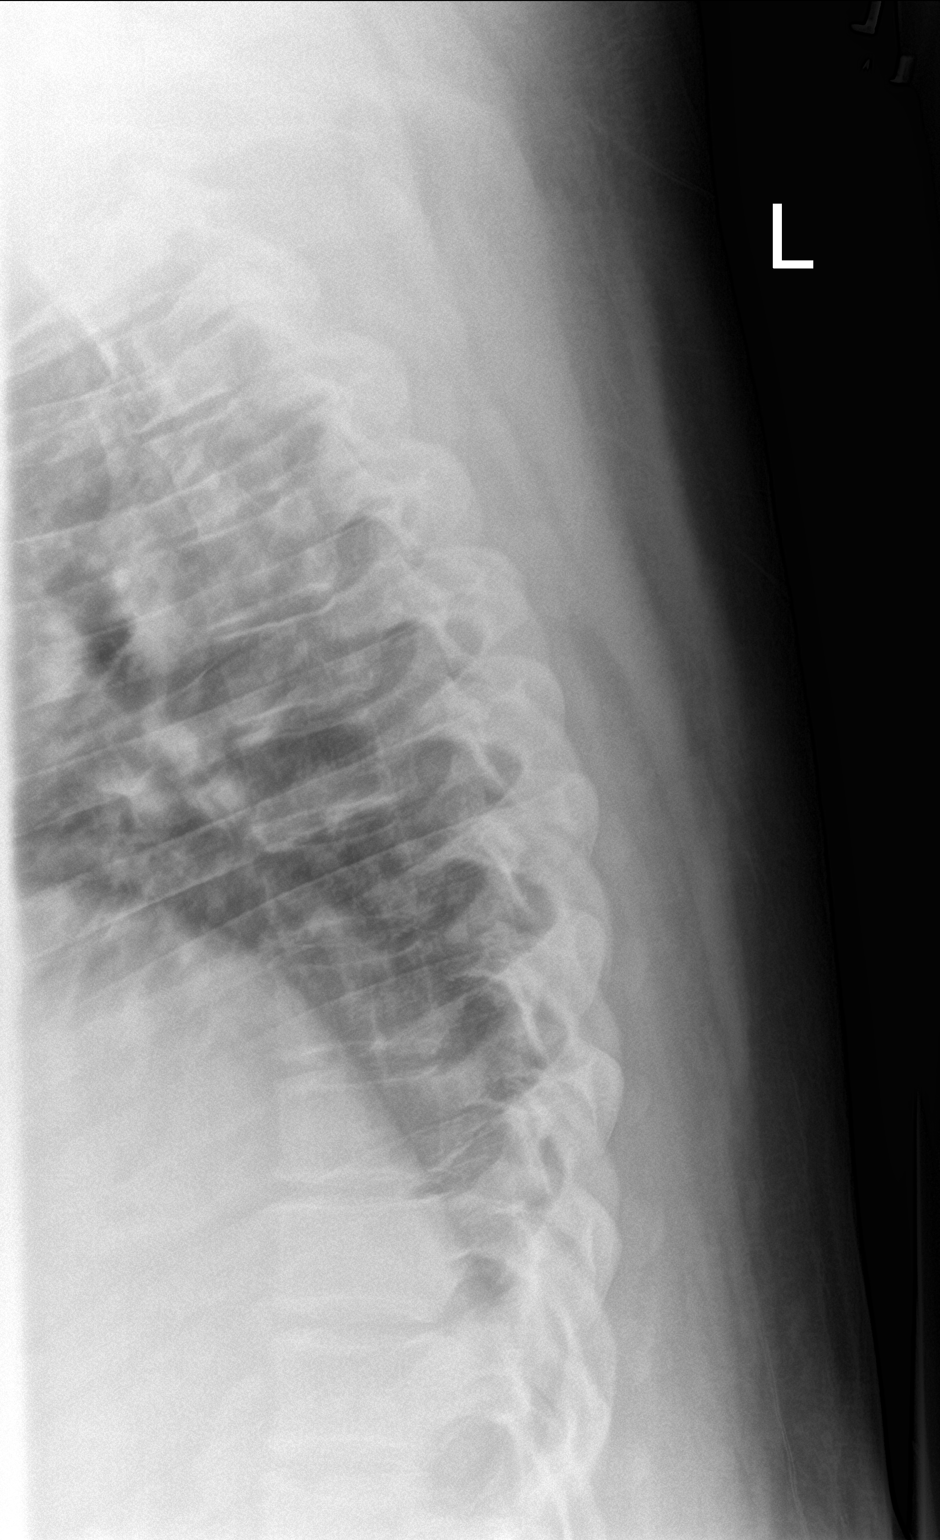
[im 3/3]
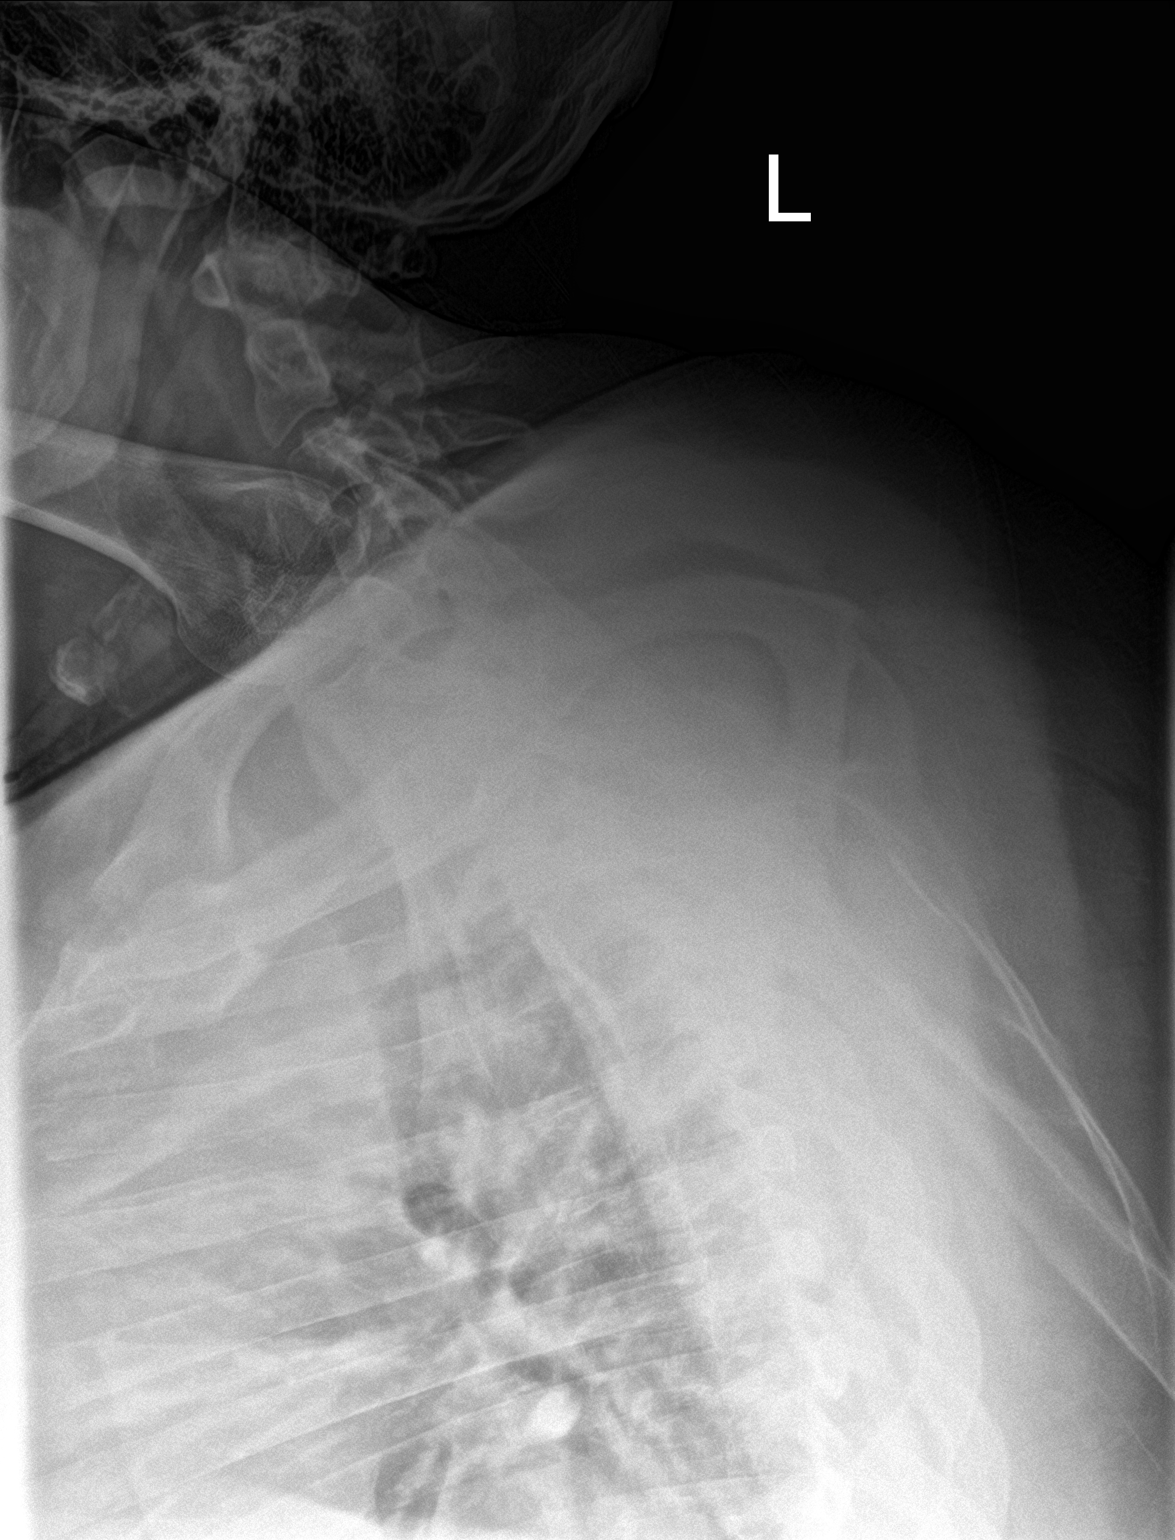

[3 of 3 positions shown; findings below may reference images not displayed]

FINDINGS: There is no evidence of thoracic spine fracture. Alignment is
normal. No other significant bone abnormalities are identified.
IMPRESSION: Negative.
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
